# Patient Record
Sex: Female | Born: 1945 | Race: Black or African American | Hispanic: No | Marital: Married | Smoking: Never smoker
Health system: Southern US, Community
[De-identification: ages and names within clinical notes are randomized; demographics above are authoritative.]

## PROBLEM LIST (undated history)

## (undated) DIAGNOSIS — I639 Cerebral infarction, unspecified: Secondary | ICD-10-CM

## (undated) DIAGNOSIS — E785 Hyperlipidemia, unspecified: Secondary | ICD-10-CM

## (undated) DIAGNOSIS — E119 Type 2 diabetes mellitus without complications: Secondary | ICD-10-CM

## (undated) DIAGNOSIS — I10 Essential (primary) hypertension: Secondary | ICD-10-CM

## (undated) HISTORY — DX: Hyperlipidemia, unspecified: E78.5

---

## 2001-01-05 ENCOUNTER — Emergency Department (HOSPITAL_COMMUNITY): Admission: EM | Admit: 2001-01-05 | Discharge: 2001-01-05 | Payer: Self-pay | Admitting: Emergency Medicine

## 2001-01-05 ENCOUNTER — Encounter: Payer: Self-pay | Admitting: Emergency Medicine

## 2001-08-15 ENCOUNTER — Ambulatory Visit (HOSPITAL_COMMUNITY): Admission: EM | Admit: 2001-08-15 | Discharge: 2001-08-15 | Payer: Self-pay | Admitting: Emergency Medicine

## 2001-08-15 ENCOUNTER — Encounter: Payer: Self-pay | Admitting: Emergency Medicine

## 2002-07-24 ENCOUNTER — Encounter: Payer: Self-pay | Admitting: Emergency Medicine

## 2002-07-25 ENCOUNTER — Inpatient Hospital Stay (HOSPITAL_COMMUNITY): Admission: EM | Admit: 2002-07-25 | Discharge: 2002-07-26 | Payer: Self-pay | Admitting: Emergency Medicine

## 2002-07-26 ENCOUNTER — Encounter: Payer: Self-pay | Admitting: Cardiovascular Disease

## 2003-01-25 ENCOUNTER — Emergency Department (HOSPITAL_COMMUNITY): Admission: EM | Admit: 2003-01-25 | Discharge: 2003-01-26 | Payer: Self-pay | Admitting: Emergency Medicine

## 2004-01-14 ENCOUNTER — Encounter: Admission: RE | Admit: 2004-01-14 | Discharge: 2004-01-14 | Payer: Self-pay | Admitting: Cardiovascular Disease

## 2004-02-08 ENCOUNTER — Encounter: Admission: RE | Admit: 2004-02-08 | Discharge: 2004-02-08 | Payer: Self-pay | Admitting: Cardiovascular Disease

## 2004-03-28 ENCOUNTER — Encounter: Admission: RE | Admit: 2004-03-28 | Discharge: 2004-03-28 | Payer: Self-pay | Admitting: Cardiovascular Disease

## 2004-09-29 ENCOUNTER — Emergency Department (HOSPITAL_COMMUNITY): Admission: AD | Admit: 2004-09-29 | Discharge: 2004-09-29 | Payer: Self-pay | Admitting: Family Medicine

## 2004-12-06 ENCOUNTER — Encounter: Admission: RE | Admit: 2004-12-06 | Discharge: 2004-12-06 | Payer: Self-pay | Admitting: Cardiovascular Disease

## 2004-12-11 ENCOUNTER — Ambulatory Visit (HOSPITAL_COMMUNITY): Admission: RE | Admit: 2004-12-11 | Discharge: 2004-12-11 | Payer: Self-pay | Admitting: Cardiovascular Disease

## 2005-03-06 ENCOUNTER — Encounter: Admission: RE | Admit: 2005-03-06 | Discharge: 2005-03-06 | Payer: Self-pay | Admitting: Cardiovascular Disease

## 2005-07-18 ENCOUNTER — Emergency Department (HOSPITAL_COMMUNITY): Admission: EM | Admit: 2005-07-18 | Discharge: 2005-07-18 | Payer: Self-pay | Admitting: Family Medicine

## 2005-10-11 ENCOUNTER — Encounter: Admission: RE | Admit: 2005-10-11 | Discharge: 2005-10-11 | Payer: Self-pay | Admitting: Cardiovascular Disease

## 2006-05-10 ENCOUNTER — Emergency Department (HOSPITAL_COMMUNITY): Admission: EM | Admit: 2006-05-10 | Discharge: 2006-05-10 | Payer: Self-pay | Admitting: Family Medicine

## 2006-10-04 ENCOUNTER — Encounter: Admission: RE | Admit: 2006-10-04 | Discharge: 2006-10-04 | Payer: Self-pay | Admitting: Cardiovascular Disease

## 2006-10-13 ENCOUNTER — Emergency Department (HOSPITAL_COMMUNITY): Admission: EM | Admit: 2006-10-13 | Discharge: 2006-10-13 | Payer: Self-pay | Admitting: Emergency Medicine

## 2006-10-14 ENCOUNTER — Encounter: Admission: RE | Admit: 2006-10-14 | Discharge: 2006-10-14 | Payer: Self-pay | Admitting: Cardiovascular Disease

## 2006-10-23 ENCOUNTER — Encounter: Admission: RE | Admit: 2006-10-23 | Discharge: 2006-10-23 | Payer: Self-pay | Admitting: Cardiovascular Disease

## 2006-10-23 ENCOUNTER — Other Ambulatory Visit: Admission: RE | Admit: 2006-10-23 | Discharge: 2006-10-23 | Payer: Self-pay | Admitting: Diagnostic Radiology

## 2006-10-23 ENCOUNTER — Encounter (INDEPENDENT_AMBULATORY_CARE_PROVIDER_SITE_OTHER): Payer: Self-pay | Admitting: Specialist

## 2007-01-01 ENCOUNTER — Observation Stay (HOSPITAL_COMMUNITY): Admission: AD | Admit: 2007-01-01 | Discharge: 2007-01-02 | Payer: Self-pay | Admitting: Cardiovascular Disease

## 2007-09-04 ENCOUNTER — Encounter: Admission: RE | Admit: 2007-09-04 | Discharge: 2007-09-04 | Payer: Self-pay | Admitting: Cardiovascular Disease

## 2008-01-19 ENCOUNTER — Emergency Department (HOSPITAL_COMMUNITY): Admission: EM | Admit: 2008-01-19 | Discharge: 2008-01-19 | Payer: Self-pay | Admitting: Emergency Medicine

## 2008-01-26 ENCOUNTER — Inpatient Hospital Stay (HOSPITAL_COMMUNITY): Admission: EM | Admit: 2008-01-26 | Discharge: 2008-01-28 | Payer: Self-pay | Admitting: Emergency Medicine

## 2010-12-08 NOTE — Cardiovascular Report (Signed)
[place]. [facility]  Patient:    Wendy Allison, Wendy Allison Visit Number: [id number] MRN: [id number]          Service Type: MED Location: 1800 [id number] [id number] Attending Physician:  Devoria Albe Dictated by:   Ricki Rodriguez, M.D. Proc. Date: 08/15/01 Admit Date:  08/15/2001                          Cardiac Catheterization  HOSPITAL LOCATION: Emergency room.  PROCEDURES: Left heart catheterization, selective coronary angiography and left ventricular function study.  INDICATIONS: This 65 year old black female, with recurrent chest pain had abnormal stress test and has failed medical therapy. The patient has a history of diabetes and hypertension, along with obesity.  APPROACH: Right femoral artery using 6 French diagnostic catheters.  COMPLICATIONS: None.  Perclose suture was applied at the end of the procedure without any complication.  HEMODYNAMIC DATA: The left ventricular pressure was 160/10 mmHg and aortic pressure 160/19 mmHg.  LEFT VENTRICULOGRAM: The left ventriculogram showed normal left ventricular systolic function with ejection fraction of 65%.  CORONARY ANATOMY: Left main: The left main coronary artery was unremarkable.  Left left anterior descending: Left anterior descending coronary artery had ostial 20% eccentric stenosis. The diagonal vessel was unremarkable.  Left circumflex: The left circumflex coronary artery was also unremarkable, had a tortuous obtuse marginal branch, and the ramus branch was unremarkable.  Right coronary artery: The right coronary artery was dominant and unremarkable.  IMPRESSION: 1. Mild left anterior descending coronary artery disease. 2. Normal left ventricular systolic function. 3. Noncardiac chest pain.  RECOMMENDATIONS: This patient will be treated medically with continuing antihypertensive medications and diabetic medication, along with emphasis on the diet and activity, and will add Protonix 40 mg one daily to  her current medication list. Dictated by:   Ricki Rodriguez, M.D. Attending Physician:  Devoria Albe DD:  08/15/01 TD:  08/16/01 Job: [id number]

## 2010-12-08 NOTE — Discharge Summary (Signed)
NAMEJAKAIYA, Wendy Allison NO.:  [id number]   MEDICAL RECORD NO.:  [id number]          PATIENT TYPE:  INP   LOCATION:  2003                         FACILITY:  [facility]   PHYSICIAN:  Eduardo Osier. Sharyn Lull, M.D. DATE OF BIRTH:  [birth year]   DATE OF ADMISSION:  01/25/2008  DATE OF DISCHARGE:  01/28/2008                               DISCHARGE SUMMARY   ADMITTING DIAGNOSES:  1. Chest pain rule out myocardial infarction.  2. Hypertension.  3. Non-insulin-dependent diabetes mellitus.  4. Morbid obesity.  5. Hypercholesteremia.  6. Positive family history of coronary artery disease.   DISCHARGE DIAGNOSES:  1. Status post chest pain, myocardial infarction ruled out.  2. Coronary artery disease.  3. Hypertension.  4. Diabetes mellitus.  5. Hypercholesteremia.  6. Morbid obesity.  7. Positive family history of coronary artery disease.   DISCHARGE HOME MEDICATIONS:  1. Enteric-coated aspirin 81 mg one tablet daily.  2. Toprol-XL 50 mg one tablet daily.  3. Diovan 160 mg one tablet daily.  4. Metformin 1000 mg one tablet twice daily.  5. Crestor 10 mg one tablet daily.  6. Nitrostat 0.4 mg sublingual use as directed.  7. Prilosec 20 mg one tablet daily.   DIET:  Low-salt, low-cholesterol, and 1800 calories ADA diet.   ACTIVITY:  As tolerated.  Increase activity slowly.  The patient has  been advised to monitor blood sugar and BP daily.   Follow up with Dr. Algie Coffer in two weeks.   Condition on discharge is stable.   BRIEF HISTORY AND HOSPITAL COURSE:  Wendy Allison is a 65 year old black  female with past medical history significant for hypertension, non-  insulin-dependent diabetes mellitus, hypercholesteremia, and Bell palsy,  morbid obesity, and positive family history of coronary artery disease.  She came to the ER via EMS complaining of retrosternal chest pain  radiating to the left arm associated with mild shortness of breath and  diaphoresis.  The patient states chest  pain lasted for 20 minutes.  She  took two sublingual nitro and baby aspirin with relief of chest pain.  Denies any history of exertional chest pain, but complains of exertional  dyspnea.  Denies palpitation, lightheadedness, or syncope.  Denies PND,  orthopnea, or leg swelling.  Denies any cough, fever, or chills.  Denies  relation of chest pain to food, breathing, or movement.   PAST MEDICAL HISTORY:  As above.   PAST SURGICAL HISTORY:  She had tubal ligation in the past and  subsequently then had hysterectomy in the past.   ALLERGIES:  No known drug allergies.   MEDICATIONS AT HOME:  She is on metformin and Diovan.   SOCIAL HISTORY:  She is married and three children.  No history of  smoking or alcohol abuse.  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   FAMILY HISTORY:  Father died of massive MI at the age of 24.  Mother's  do not know.  One sister had irregular heartbeat.   PHYSICAL EXAMINATION:  GENERAL:  She was alert and oriented x3, in no  acute distress.  VITAL SIGNS:  Blood pressure was 148/73 and pulse was 98 and regular.  HEENT:  Conjunctiva was pink.  NECK:  Supple.  No JVD and no bruit.  LUNGS:  Clear to auscultation without rhonchi or rales.  CARDIOVASCULAR:  S1 and S2 was normal.  There was soft systolic murmur.  ABDOMEN:  Soft, obese, and nontender.  EXTREMITIES:  There is no clubbing, cyanosis, or edema.   EKG done in the ER showed normal sinus rhythm with poor R-wave  progression and nonspecific ST-T wave changes.  Three sets of cardiac  enzymes were normal.  Cholesterol was 134.  HDL was low 33.  LDL was 84.  Her hemoglobin A1c was 9.1, which was elevated.  Hemoglobin was 12.4,  hematocrit 38.2, white count of 11.3.  Electrolyte sodium 139, potassium  3.6, chloride 103, bicarb 28, glucose 238, BUN 21, and creatinine 0.80.  Persantine Myoview done on July 7, showed no evidence of reversible  ischemia and normal LV function with EF of 66%.   BRIEF HOSPITAL  COURSE:  The patient was admitted to telemetry unit.  MI  was ruled out by serial enzymes and EKG.  The patient subsequently  underwent Persantine Myoview, which showed no evidence of reversible  ischemia with normal EF.  The patient did not had any further episodes  of chest pain during the hospital stay and was discharged home on above  medications.      Eduardo Osier. Sharyn Lull, M.D.  Electronically Signed    MNH/MEDQ  D:  02/26/2008  T:  02/27/2008  Job:  [id number]   cc:   Ricki Rodriguez, M.D.

## 2010-12-08 NOTE — Discharge Summary (Signed)
NAMEROSAMOND, ANDRESS NO.:  [id number]   MEDICAL RECORD NO.:  [id number]          PATIENT TYPE:  OBV   LOCATION:  3742                         FACILITY:  [facility]   PHYSICIAN:  Ricki Rodriguez, M.D.  DATE OF BIRTH:  26-Apr-1946   DATE OF ADMISSION:  01/01/2007  DATE OF DISCHARGE:  01/02/2007                               DISCHARGE SUMMARY   FINAL DIAGNOSES:  1. Chest pain.  2. Diabetes mellitus, II.  3. Hypertension.  4. Obesity.  5. Old cerebrovascular accident.   DISCHARGE MEDICATIONS:  1. Diovan 80/12.5 mg 1 daily.  2. Glipizide 5 mg daily.  3. Darvocet-N 100 four times daily.  4. Nitroglycerin 0.4 mg 1 sublingual every 5 minutes x3 as needed for      chest pain.  5. Kay Ciel 10 mEq 1 daily.  6. Aspirin 81 mg 1 daily.  7. Metoprolol 225 mg daily.  8. Multivitamin 1 daily.  9. Lipitor 10 mg 1 daily.   DISCHARGE DIET:  Low sodium, heart healthy diet.   DISCHARGE ACTIVITY:  The patient is to increase activity slowly.   SPECIAL INSTRUCTIONS:  The patient to stop any activity that causes  chest pain, shortness of breath, dizziness, sweating, or excessive  weakness.   FOLLOWUP:  Followup by Dr. Orpah Cobb in 2 weeks.  The patient to call  [phone] for appointment.   CONDITION ON DISCHARGE:  Improved.   HISTORY:  This 65 year old, black female presented with 1 day history of  chest pain, sharp, retrosternal, recurrent associated with dizziness.   PHYSICAL EXAMINATION:  VITAL SIGNS:  Pulse 82, respirations 14, blood  pressure 122/67, height 71 inches, weight 250 pounds.  HEENT:  The patient is normocephalic atraumatic.  Has brown eyes.  Pupils are equal and reactive to light.  Extraocular movement intact.  NECK:  No JVD.  No carotid bruits.  LUNGS:  Clear bilaterally.  HEART:  Normal S1, S2.  ABDOMEN:  Soft.  EXTREMITIES:  No edema, cyanosis, or clubbing.  CNS:  Cranial nerves grossly intact, and the patient moves all 4  extremities.   LABORATORY  DATA:  1. Borderline WBC count 11.8, hemoglobin borderline at 11.6, normal      platelet count, normal PT/INR, normal electrolytes.  Blood sugar      elevated at 216, BUN 7, creatinine 0.76, albumin low at 3.1,      alkaline phosphatase slightly high at 134.  The rest of the liver      enzymes were normal.  CK-MB and troponin I negative x3.      Cholesterol 154, triglyceride 103, HDL cholesterol low at 32, LDL      cholesterol 101.  Thyroid-stimulating hormone was low at 0.007.   1. Nuclear stress test showed no evidence of pharmacologically      inducible ischemia.  Ejection fraction was 67%.   EKG revealed a normal sinus rhythm.   Chest x-ray revealed cardiac enlargement without heart failure   HOSPITAL COURSE:  The patient was admitted to Telemetry Unit.  Myocardial infarction was ruled out.  She underwent nuclear stress test  that failed to show any reversible ischemia.  Hence, the patient was  discharged home in satisfactory condition with adjustment in her  medications.  Her diabetes condition would be managed on outpatient  level.      Ricki Rodriguez, M.D.  Electronically Signed     ASK/MEDQ  D:  02/05/2007  T:  02/06/2007  Job:  [id number]

## 2010-12-08 NOTE — Discharge Summary (Signed)
Wendy Allison, BOTTENFIELD NO.:  [id number]   MEDICAL RECORD NO.:  [id number]                   PATIENT TYPE:  INP   LOCATION:  5509 Bed 1                           FACILITY:  [facility]   PHYSICIAN:  Ricki Rodriguez, M.D.               DATE OF BIRTH:  1946/05/09   DATE OF ADMISSION:  07/25/2002  DATE OF DISCHARGE:  07/26/2002                                 DISCHARGE SUMMARY   FINAL DIAGNOSES:  1. Chest pain.  2. Diabetes mellitus.  3. Obesity.  4. Hypertension.   DISCHARGE MEDICATIONS:  1. Diovan HCT 80/12.5 mg 1 p.o. daily.  2. Glucotrol XL 5 mg 1 p.o. daily.  3. Darvocet-N 100 1 four times daily as needed for chest pain.  4. K-Dur 10 mEq 1 daily.  5. Nitroglycerin 0.4 mg one under the tongue as needed for chest pain every     5 minutes as directed.  6. Aspirin 81 mg one daily.  7. Pain management as needed, Darvocet.   ACTIVITY:  As tolerated.   DISCHARGE DIET:  Low fat, low salt diabetic diet.   FOLLOWUP:  Followup by Ricki Rodriguez, M.D. in three weeks and by Dr. Sande Brothers in two weeks.   HISTORY:  This 65 year old black female complained of midline sharp to dull  chest pain radiating to her left arm.  The patient had cardiac  catheterization done one year ago with a minimal coronary artery disease.  Chest pain also is increased by taking deep breaths without any fever or  cough.  Has cardiac risk factors of diabetes, hypertension and obesity.   PHYSICAL EXAMINATION:  VITAL SIGNS:  Temperature 98, pulse 81, respirations  20, blood pressure 153/75.  Height:  5 feet, 8 inches.  Weight:  250 pounds.  GENERAL:  Alert, oriented x3.  HEENT:  Head:  Normocephalic, atraumatic.  Eyes:  Manson Passey.  Pupils equal,  reactive to light.  Extraocular movements intact.  Mucous membranes pink and  moist.  NECK:  No JVD, no thyromegaly.  LUNGS:  Clear.  CHEST:  Chest wall tender in the midsternal area.  HEART:  Normal, S1, S2 with a 2/6 midsystolic murmur.  ABDOMEN:  Soft and distended.  EXTREMITIES:  No cyanosis, clubbing.  1+ edema bilaterally.  CNS:  Bilaterally equal grips.   LABORATORY DATA:  Slightly low potassium of 3.2, CK 49, MB 0.01.  EKG:  Normal sinus rhythm.  Subsequent potassium was 3.8.  Nuclear stress test  negative for any reversible ischemia.   HOSPITAL COURSE:  The patient was admitted to telemetry unit.  Myocardial  infarction was ruled out.  She underwent a nuclear stress test that did not  show any reversible ischemia and her chest pain being more noncardiac, she  was discharged home in satisfactory condition with followup by me in two  weeks if needed and by primary care doctor in two weeks.  Ricki Rodriguez, M.D.    ASK/MEDQ  D:  08/27/2002  T:  08/27/2002  Job:  [id number]   cc:   Sande Brothers, M.D.

## 2011-04-19 LAB — BASIC METABOLIC PANEL
CO2: 25
Chloride: 101
Creatinine, Ser: 0.72
GFR calc Af Amer: >60
Potassium: 3.4 — ABNORMAL LOW

## 2011-04-19 LAB — HEMOGLOBIN A1C: Mean Plasma Glucose: 247

## 2011-04-19 LAB — DIFFERENTIAL
Basophils Relative: 0
Lymphocytes Relative: 23
Lymphs Abs: 2.7
Monocytes Absolute: 0.4
Monocytes Relative: 3
Neutro Abs: 8.2 — ABNORMAL HIGH

## 2011-04-19 LAB — CARDIAC PANEL(CRET KIN+CKTOT+MB+TROPI)
CK, MB: 0.5
CK, MB: 0.6
Relative Index: INVALID
Troponin I: <0.01

## 2011-04-19 LAB — COMPREHENSIVE METABOLIC PANEL
AST: 20
Albumin: 3.4 — ABNORMAL LOW
BUN: 18
Calcium: 9.9
Creatinine, Ser: 0.8
GFR calc Af Amer: >60
GFR calc non Af Amer: >60
Total Bilirubin: 0.7

## 2011-04-19 LAB — CBC
Hemoglobin: 12.4
MCHC: 32.5
Platelets: 258
RBC: 4.55
RBC: 4.89
WBC: 10.4
WBC: 11.3 — ABNORMAL HIGH

## 2011-04-19 LAB — LIPID PANEL
Cholesterol: 134
HDL: 33 — ABNORMAL LOW
LDL Cholesterol: 84
Triglycerides: 85

## 2011-04-19 LAB — CK TOTAL AND CKMB (NOT AT ARMC)
CK, MB: 0.7
Relative Index: INVALID
Total CK: 29

## 2011-04-19 LAB — POCT I-STAT, CHEM 8
BUN: 21
Calcium, Ion: 1.22
Chloride: 103
Creatinine, Ser: 1
Glucose, Bld: 238 — ABNORMAL HIGH
Potassium: 3.6

## 2011-04-19 LAB — POCT CARDIAC MARKERS
CKMB, poc: <1 — ABNORMAL LOW
Operator id: 270651
Troponin i, poc: <0.05
Troponin i, poc: <0.05

## 2011-04-19 LAB — HEPARIN LEVEL (UNFRACTIONATED): Heparin Unfractionated: 0.83 — ABNORMAL HIGH

## 2011-04-19 LAB — PROTIME-INR
INR: 0.9
Prothrombin Time: 12.8

## 2011-04-19 LAB — TROPONIN I: Troponin I: <0.01

## 2011-05-10 LAB — COMPREHENSIVE METABOLIC PANEL
AST: 18
Albumin: 3.1 — ABNORMAL LOW
Alkaline Phosphatase: 134 — ABNORMAL HIGH
BUN: 7
GFR calc Af Amer: >60
Potassium: 3.7
Sodium: 141
Total Protein: 7.2

## 2011-05-10 LAB — PROTIME-INR: INR: 1

## 2011-05-10 LAB — B-NATRIURETIC PEPTIDE (CONVERTED LAB): Pro B Natriuretic peptide (BNP): <30

## 2011-05-10 LAB — CBC
HCT: 35.3 — ABNORMAL LOW
HCT: 36.2
Hemoglobin: 11.5 — ABNORMAL LOW
MCHC: 32.7
Platelets: 275
RDW: 16.9 — ABNORMAL HIGH
RDW: 16.9 — ABNORMAL HIGH
WBC: 11.8 — ABNORMAL HIGH

## 2011-05-10 LAB — CARDIAC PANEL(CRET KIN+CKTOT+MB+TROPI)
CK, MB: 0.6
Relative Index: INVALID
Relative Index: INVALID
Total CK: 31
Total CK: 33
Troponin I: 0.01

## 2011-05-10 LAB — DIFFERENTIAL
Basophils Relative: 0
Eosinophils Relative: 1
Lymphocytes Relative: 31
Monocytes Absolute: 1.1 — ABNORMAL HIGH
Monocytes Relative: 9
Neutro Abs: 7

## 2011-05-10 LAB — APTT: aPTT: 33

## 2011-05-10 LAB — LIPID PANEL
Cholesterol: 154
LDL Cholesterol: 101 — ABNORMAL HIGH

## 2014-09-20 DIAGNOSIS — M1990 Unspecified osteoarthritis, unspecified site: Secondary | ICD-10-CM | POA: Diagnosis not present

## 2014-09-20 DIAGNOSIS — I10 Essential (primary) hypertension: Secondary | ICD-10-CM | POA: Diagnosis not present

## 2014-09-20 DIAGNOSIS — E119 Type 2 diabetes mellitus without complications: Secondary | ICD-10-CM | POA: Diagnosis not present

## 2014-09-20 DIAGNOSIS — E118 Type 2 diabetes mellitus with unspecified complications: Secondary | ICD-10-CM | POA: Diagnosis not present

## 2014-09-20 DIAGNOSIS — E0590 Thyrotoxicosis, unspecified without thyrotoxic crisis or storm: Secondary | ICD-10-CM | POA: Diagnosis not present

## 2014-09-20 DIAGNOSIS — E780 Pure hypercholesterolemia: Secondary | ICD-10-CM | POA: Diagnosis not present

## 2014-09-20 DIAGNOSIS — I2510 Atherosclerotic heart disease of native coronary artery without angina pectoris: Secondary | ICD-10-CM | POA: Diagnosis not present

## 2014-09-20 DIAGNOSIS — F329 Major depressive disorder, single episode, unspecified: Secondary | ICD-10-CM | POA: Diagnosis not present

## 2014-10-06 DIAGNOSIS — R9431 Abnormal electrocardiogram [ECG] [EKG]: Secondary | ICD-10-CM | POA: Diagnosis not present

## 2014-10-06 DIAGNOSIS — I493 Ventricular premature depolarization: Secondary | ICD-10-CM | POA: Diagnosis not present

## 2014-10-06 DIAGNOSIS — K921 Melena: Secondary | ICD-10-CM | POA: Diagnosis not present

## 2014-11-10 DIAGNOSIS — E0590 Thyrotoxicosis, unspecified without thyrotoxic crisis or storm: Secondary | ICD-10-CM | POA: Diagnosis not present

## 2014-11-10 DIAGNOSIS — Z139 Encounter for screening, unspecified: Secondary | ICD-10-CM | POA: Diagnosis not present

## 2014-11-10 DIAGNOSIS — I10 Essential (primary) hypertension: Secondary | ICD-10-CM | POA: Diagnosis not present

## 2014-11-10 DIAGNOSIS — E785 Hyperlipidemia, unspecified: Secondary | ICD-10-CM | POA: Diagnosis not present

## 2014-11-10 DIAGNOSIS — Z1159 Encounter for screening for other viral diseases: Secondary | ICD-10-CM | POA: Diagnosis not present

## 2014-11-10 DIAGNOSIS — E119 Type 2 diabetes mellitus without complications: Secondary | ICD-10-CM | POA: Diagnosis not present

## 2014-11-11 DIAGNOSIS — Z1231 Encounter for screening mammogram for malignant neoplasm of breast: Secondary | ICD-10-CM | POA: Diagnosis not present

## 2014-11-11 DIAGNOSIS — E119 Type 2 diabetes mellitus without complications: Secondary | ICD-10-CM | POA: Diagnosis not present

## 2014-11-25 DIAGNOSIS — H409 Unspecified glaucoma: Secondary | ICD-10-CM | POA: Diagnosis not present

## 2014-11-25 DIAGNOSIS — E119 Type 2 diabetes mellitus without complications: Secondary | ICD-10-CM | POA: Diagnosis not present

## 2014-11-25 DIAGNOSIS — E0590 Thyrotoxicosis, unspecified without thyrotoxic crisis or storm: Secondary | ICD-10-CM | POA: Diagnosis not present

## 2014-11-25 DIAGNOSIS — I159 Secondary hypertension, unspecified: Secondary | ICD-10-CM | POA: Diagnosis not present

## 2014-11-25 DIAGNOSIS — E785 Hyperlipidemia, unspecified: Secondary | ICD-10-CM | POA: Diagnosis not present

## 2015-01-19 DIAGNOSIS — E0590 Thyrotoxicosis, unspecified without thyrotoxic crisis or storm: Secondary | ICD-10-CM | POA: Diagnosis not present

## 2015-01-19 DIAGNOSIS — E119 Type 2 diabetes mellitus without complications: Secondary | ICD-10-CM | POA: Diagnosis not present

## 2015-02-04 DIAGNOSIS — H2513 Age-related nuclear cataract, bilateral: Secondary | ICD-10-CM | POA: Diagnosis not present

## 2015-02-04 DIAGNOSIS — H4011X1 Primary open-angle glaucoma, mild stage: Secondary | ICD-10-CM | POA: Diagnosis not present

## 2015-02-04 DIAGNOSIS — E119 Type 2 diabetes mellitus without complications: Secondary | ICD-10-CM | POA: Diagnosis not present

## 2015-03-15 DIAGNOSIS — E119 Type 2 diabetes mellitus without complications: Secondary | ICD-10-CM | POA: Diagnosis not present

## 2015-03-17 DIAGNOSIS — H9313 Tinnitus, bilateral: Secondary | ICD-10-CM | POA: Diagnosis not present

## 2015-03-17 DIAGNOSIS — E119 Type 2 diabetes mellitus without complications: Secondary | ICD-10-CM | POA: Diagnosis not present

## 2015-03-17 DIAGNOSIS — E785 Hyperlipidemia, unspecified: Secondary | ICD-10-CM | POA: Diagnosis not present

## 2015-03-17 DIAGNOSIS — Z23 Encounter for immunization: Secondary | ICD-10-CM | POA: Diagnosis not present

## 2015-03-17 DIAGNOSIS — I10 Essential (primary) hypertension: Secondary | ICD-10-CM | POA: Diagnosis not present

## 2015-03-17 DIAGNOSIS — E0590 Thyrotoxicosis, unspecified without thyrotoxic crisis or storm: Secondary | ICD-10-CM | POA: Diagnosis not present

## 2015-03-31 DIAGNOSIS — H9313 Tinnitus, bilateral: Secondary | ICD-10-CM | POA: Diagnosis not present

## 2015-03-31 DIAGNOSIS — H903 Sensorineural hearing loss, bilateral: Secondary | ICD-10-CM | POA: Diagnosis not present

## 2015-12-08 ENCOUNTER — Emergency Department (HOSPITAL_COMMUNITY): Payer: Medicare Other

## 2015-12-08 ENCOUNTER — Encounter (HOSPITAL_COMMUNITY): Payer: Self-pay | Admitting: Emergency Medicine

## 2015-12-08 ENCOUNTER — Emergency Department (HOSPITAL_COMMUNITY)
Admission: EM | Admit: 2015-12-08 | Discharge: 2015-12-08 | Disposition: A | Discharge status: Home / Self Care | Payer: Medicare Other | Attending: Emergency Medicine | Admitting: Emergency Medicine

## 2015-12-08 DIAGNOSIS — M25569 Pain in unspecified knee: Secondary | ICD-10-CM | POA: Diagnosis not present

## 2015-12-08 DIAGNOSIS — M25551 Pain in right hip: Secondary | ICD-10-CM | POA: Diagnosis not present

## 2015-12-08 DIAGNOSIS — M1990 Unspecified osteoarthritis, unspecified site: Secondary | ICD-10-CM

## 2015-12-08 DIAGNOSIS — M169 Osteoarthritis of hip, unspecified: Secondary | ICD-10-CM | POA: Insufficient documentation

## 2015-12-08 DIAGNOSIS — S8992XA Unspecified injury of left lower leg, initial encounter: Secondary | ICD-10-CM

## 2015-12-08 DIAGNOSIS — Y939 Activity, unspecified: Secondary | ICD-10-CM | POA: Insufficient documentation

## 2015-12-08 DIAGNOSIS — M25562 Pain in left knee: Secondary | ICD-10-CM | POA: Diagnosis not present

## 2015-12-08 DIAGNOSIS — M179 Osteoarthritis of knee, unspecified: Secondary | ICD-10-CM | POA: Diagnosis not present

## 2015-12-08 DIAGNOSIS — E119 Type 2 diabetes mellitus without complications: Secondary | ICD-10-CM | POA: Diagnosis not present

## 2015-12-08 DIAGNOSIS — Y999 Unspecified external cause status: Secondary | ICD-10-CM | POA: Diagnosis not present

## 2015-12-08 DIAGNOSIS — M7989 Other specified soft tissue disorders: Secondary | ICD-10-CM | POA: Diagnosis not present

## 2015-12-08 DIAGNOSIS — M25572 Pain in left ankle and joints of left foot: Secondary | ICD-10-CM | POA: Diagnosis not present

## 2015-12-08 DIAGNOSIS — W109XXA Fall (on) (from) unspecified stairs and steps, initial encounter: Secondary | ICD-10-CM | POA: Insufficient documentation

## 2015-12-08 DIAGNOSIS — Y9289 Other specified places as the place of occurrence of the external cause: Secondary | ICD-10-CM | POA: Diagnosis not present

## 2015-12-08 DIAGNOSIS — R52 Pain, unspecified: Secondary | ICD-10-CM

## 2015-12-08 DIAGNOSIS — S79911A Unspecified injury of right hip, initial encounter: Secondary | ICD-10-CM | POA: Diagnosis not present

## 2015-12-08 DIAGNOSIS — M1611 Unilateral primary osteoarthritis, right hip: Secondary | ICD-10-CM | POA: Diagnosis not present

## 2015-12-08 DIAGNOSIS — W108XXA Fall (on) (from) other stairs and steps, initial encounter: Secondary | ICD-10-CM

## 2015-12-08 DIAGNOSIS — Z8673 Personal history of transient ischemic attack (TIA), and cerebral infarction without residual deficits: Secondary | ICD-10-CM | POA: Insufficient documentation

## 2015-12-08 DIAGNOSIS — T148 Other injury of unspecified body region: Secondary | ICD-10-CM | POA: Diagnosis not present

## 2015-12-08 HISTORY — DX: Type 2 diabetes mellitus without complications: E11.9

## 2015-12-08 HISTORY — DX: Cerebral infarction, unspecified: I63.9

## 2015-12-08 NOTE — ED Notes (Signed)
Patient transported to X-ray 

## 2015-12-08 NOTE — ED Notes (Signed)
Per EMS. Pt fell down steps of bus today. Pt has chronic R leg weakness. Fell onto L side. Pt did not have cane at time of fall. No LOC or deformities.

## 2015-12-08 NOTE — ED Provider Notes (Signed)
CSN: [id number]     Arrival date & time 12/08/15  1454 History   By signing my name below, I, Marisue Humble, attest that this documentation has been prepared under the direction and in the presence of non-physician practitioner, Trixie Dredge, PA-C. Electronically Signed: Marisue Humble, Scribe. 12/08/2015. 4:11 PM.   Chief Complaint  Patient presents with  . Fall   The history is provided by the patient. No language interpreter was used.   HPI Comments:  Wendy Allison is a 70 y.o. female with PMhx of stroke and DM who presents to the Emergency Department via EMS s/p fall complaining of left knee pain. She was stepping off the bus and fell off the bottom stair to the ground, landing on her left knee. Pt states her right leg gave out, causing her to fall. Pt has chronic right hip and leg pain, worse directly after standing up, for the past year. She has not been evaluated for this pain; she moved to [place] from [place] in December.This right hip pain is unchanged. No alleviating factors noted or treatments attempted PTA. Last Tetanus 3-4 years ago. Denies weakness, numbness in feet, head trauma, syncope, back pain, abdominal pain, neck pain or headache.  Denies any other injury other than left knee.   Past Medical History  Diagnosis Date  . Stroke (HCC)   . Diabetes mellitus without complication (HCC)    History reviewed. No pertinent past surgical history. History reviewed. No pertinent family history. Social History  Substance Use Topics  . Smoking status: Never Smoker   . Smokeless tobacco: None  . Alcohol Use: No   OB History    No data available     Review of Systems  Constitutional: Negative for activity change and appetite change.  Cardiovascular: Negative for chest pain.  Gastrointestinal: Negative for abdominal pain.  Musculoskeletal: Positive for myalgias, joint swelling and arthralgias. Negative for neck pain.  Allergic/Immunologic: Negative for  immunocompromised state.  Neurological: Negative for syncope, weakness, numbness and headaches.  Hematological: Does not bruise/bleed easily.    Allergies  Review of patient's allergies indicates no known allergies.  Home Medications   Prior to Admission medications   Not on File   BP 181/79 mmHg  Pulse 93  Temp(Src) 98.3 F (36.8 C) (Oral)  SpO2 97% Physical Exam  Constitutional: She appears well-developed and well-nourished. No distress.  HENT:  Head: Normocephalic and atraumatic.  Neck: Neck supple.  Pulmonary/Chest: Effort normal.  Abdominal: Soft. She exhibits no distension and no mass. There is no tenderness. There is no rebound and no guarding.  Musculoskeletal:  Abrasion to inferior left knee; tenderness over proximal tibia and into left knee; BL pitting edema to the shin; pain in proximal thigh with ROM of right hip; Spine nontender, no crepitus, or stepoffs.  Neurological: She is alert.  Skin: She is not diaphoretic.  Nursing note and vitals reviewed.   ED Course  Procedures  DIAGNOSTIC STUDIES:  Oxygen Saturation is 97% on RA, normal by my interpretation.    COORDINATION OF CARE:  4:06 PM Will order x-ray of unilateral hips with pelvis and CT of left knee. Discussed treatment plan with pt at bedside and pt agreed to plan.  Labs Review Labs Reviewed - No data to display  Imaging Review Dg Ankle Complete Left  12/08/2015  CLINICAL DATA:  Fall today, chronic right leg weakness. Anterior left knee pain and left ankle pain. EXAM: LEFT ANKLE COMPLETE - 3+ VIEW COMPARISON:  None. FINDINGS: Osseous alignment  is normal. Bone mineralization is normal. No fracture line or displaced fracture fragment identified. Ankle mortise is symmetric. There is soft tissue swelling overlying the medial malleolus. Chronic enthesophytes noted along the dorsal and plantar margins of the calcaneus. IMPRESSION: 1. No acute fracture or dislocation. 2. Soft tissue swelling. Electronically  Signed   By: Bary RichardStan  Maynard M.D.   On: 12/08/2015 15:36   Ct Knee Left Wo Contrast  12/08/2015  CLINICAL DATA:  Anterior left knee pain after falling today. Initial encounter. EXAM: CT OF THE LEFT KNEE WITHOUT CONTRAST TECHNIQUE: Multidetector CT imaging of the left knee was performed according to the standard protocol. Multiplanar CT image reconstructions were also generated. COMPARISON:  Radiograph same date. FINDINGS: There is some motion artifact on the more inferior images which were repeated. Bones: No evidence of acute fracture or dislocation. Joint/cartilage: Moderate tricompartmental osteoarthritis with prominent osteophytes. No evidence of loose body or significant joint effusion. Ligaments: As evaluated by CT, the cruciate and collateral ligaments appear intact. Tendon/muscles: The extensor mechanism is intact. No muscular abnormalities are seen. Neurovascular/other soft tissues: Minimal anterior subcutaneous edema without focal fluid collection. IMPRESSION: 1. No acute osseous findings at the left knee. 2. Tricompartmental osteoarthritis. No significant joint effusion or evidence of loose body. Electronically Signed   By: Carey BullocksWilliam  Veazey M.D.   On: 12/08/2015 16:44   Dg Knee Complete 4 Views Left  12/08/2015  CLINICAL DATA:  Anterior left knee pain status post fall. EXAM: LEFT KNEE - COMPLETE 4+ VIEW COMPARISON:  03/06/2005 FINDINGS: No evidence of displaced fracture, dislocation, or joint effusion. There are 3 compartment osteoarthritic changes of the left knee, with joint space narrowing, subchondral sclerosis, osteophyte formation and mild chondrocalcinosis. There is mild depression of the lateral tibial plateau, which is felt to be degenerative in nature. Correlation to physical exam however is recommended. Soft tissues are unremarkable. IMPRESSION: No definitive evidence of displaced fracture. Mild depression of the lateral tibial plateau, likely degenerative in nature. Correlation to physical  exam is recommended. 3 compartment moderate in severity osteoarthritic changes of the left knee. Electronically Signed   By: Ted Mcalpineobrinka  Dimitrova M.D.   On: 12/08/2015 15:41   Dg Hip Unilat With Pelvis 2-3 Views Right  12/08/2015  CLINICAL DATA:  RIGHT hip pain today.  Fall from bus. EXAM: DG HIP (WITH OR WITHOUT PELVIS) 2-3V RIGHT COMPARISON:  None. FINDINGS: Hips are located. No evidence of pelvic fracture or sacral fracture. Dedicated view of the RIGHT hip demonstrates no femoral neck fracture. There is sclerosis of the LEFT and RIGHT hip joint, greater on the RIGHT. IMPRESSION: No evidence of pelvic fracture or RIGHT hip fracture. 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 Electronically Signed   By: Genevive BiStewart  Edmunds M.D.   On: 12/08/2015 16:33   I have personally reviewed and evaluated these images and lab results as part of my medical decision-making.   EKG Interpretation None      MDM   Final diagnoses:  Fall (on) (from) other stairs and steps, initial encounter  Left knee injury, initial encounter  Arthritis    Afebrile, nontoxic patient with injury to her left knee after fall from last step of bus onto ground.  Has chronic right hip pain and this is what she states caused the fall.   Xray left knee concerning for arthritis vs fracture.  CT confirms arthritis only.  Right hip film also with arthritis.  Neurovascularly intact.   D/C home with close PCP follow up, resources given.  Rx for walking cane also given.  Discussed result, findings, treatment, and follow up  with patient.  Pt given return precautions.  Pt verbalizes understanding and agrees with plan.        I personally performed the services described in this documentation, which was scribed in my presence. The recorded information has been reviewed and is accurate.    Trixie Dredge, PA-C 12/08/15 1720  Arby Barrette, MD 12/08/15 [phone]

## 2015-12-08 NOTE — Discharge Instructions (Signed)
Read the information below.  You may return to the Emergency Department at any time for worsening condition or any new symptoms that concern you.   Arthritis Arthritis means joint pain. It can also mean joint disease. A joint is a place where bones come together. People who have arthritis may have:  Red joints.  Swollen joints.  Stiff joints.  Warm joints.  A fever.  A feeling of being sick. HOME CARE Pay attention to any changes in your symptoms. Take these actions to help with your pain and swelling. Medicines  Take over-the-counter and prescription medicines only as told by your doctor.  Do not take aspirin for pain if your doctor says that you may have gout. Activities  Rest your joint if your doctor tells you to.  Avoid activities that make the pain worse.  Exercise your joint regularly as told by your doctor. Try doing exercises like:  Swimming.  Water aerobics.  Biking.  Walking. Joint Care  If your joint is swollen, keep it raised (elevated) if told by your doctor.  If your joint feels stiff in the morning, try taking a warm shower.  If you have diabetes, do not apply heat without asking your doctor.  If told, apply heat to the joint:  Put a towel between the joint and the hot pack or heating pad.  Leave the heat on the area for 20-30 minutes.  If told, apply ice to the joint:  Put ice in a plastic bag.  Place a towel between your skin and the bag.  Leave the ice on for 20 minutes, 2-3 times per day.  Keep all follow-up visits as told by your doctor. GET HELP IF:  The pain gets worse.  You have a fever. GET HELP RIGHT AWAY IF:  You have very bad pain in your joint.  You have swelling in your joint.  Your joint is red.  Many joints become painful and swollen.  You have very bad back pain.  Your leg is very weak.  You cannot control your pee (urine) or poop (stool).   This information is not intended to replace advice given to  you by your health care provider. Make sure you discuss any questions you have with your health care provider.   Document Released: 10/03/2009 Document Revised: 03/30/2015 Document Reviewed: 10/04/2014 Elsevier Interactive Patient Education 2016 [employer].   [employer] Assistance The United Ways 211 is a great source of information about community services available.  Access by dialing 2-1-1 from anywhere in [place], or by website -  PooledIncome.pl.   Other Local Resources (Updated 07/2015)  Financial Assistance   Services    Phone Number and Address  [facility]  Low-cost medical care - 1st and 3rd Saturday of every month  Must not qualify for public or private insurance and must have limited income [phone] [address] [place], [place]    [place] [employer] of Social Services  Child care  Emergency assistance for housing and [employer]  Medicaid [phone] [address] [place], [place] [address]   [facility] Department  Low-cost medical care for children, communicable diseases, sexually-transmitted diseases, immunizations, maternity care, womens health and family planning [phone] [address] [place], [place] [address]  [facility]   Medication assistance for [facility] residents  Must meet income requirements [phone] [address] [place], [place].    [facility]  Child care  Emergency assistance for housing and [employer]ly[employer]arkutilities  Food stamps  Medicaid [phone] [id number] [address] [place], [place] [address]  [facility]   Low-cost medical care,   Monday through Friday, 9 am to 6 pm.   Accepts Medicare/Medicaid, and self-pay [phone] [address] [place], [place] [address]  [facility]  Low-cost medical care - Monday  through Friday, 8:30 am - 5:30 pm  Accepts Medicaid and self-pay [phone] [address] [place], [place] [address]   [facility]  Primary medical care, including for those with sickle cell disease  Accepts Medicare, Medicaid, insurance and self-pay [phone] [address] [place], [place]  [facility]   Primary medical care  Accepts Medicare, [place], insurance and self-pay [phone] [address], [address] [place], [place] [address]   [facility]  Child care  Emergency assistance for housing and [employer]ly[employer]arkutilities  Food stamps  Medicaid [phone] [address] [place], [place] [address]  [facility] of Health and [employer] Services  Child care  Emergency assistance for housing and [employer]arkutilities  Food stamps  Medicaid [phone] [address] [place], [place] [address]   [facility] Medication Assistance Program  Medication assistance for [facility] residents with no insurance only  Must have a primary care doctor [phone] [address] [place], [place]  [facility]   Primary medical care  [place] Medicare, [place], insurance  [phone] [address] [place], [place]  [facility]   Medication assistance [phone]  [facility]   Primary medical care  Accepts Medicare, [place], insurance and self-pay [phone] [address] [place], [place] [address]  [facility]   Primary medical care  Accepts Medicare, [place], insurance and self-pay [phone] [address] [place], [place] [address]  [facility]  For [place] residents between the ages of 7018 and 5764 who do not have any form of health insurance, Medicare, [place], or [place] benefits.  Services are provided free of charge to uninsured patients who fall within federal poverty  guidelines.    Hours: Tuesdays and Thursdays, 4:15 - 8 pm [phone] [address], [address] [place], [place] [address]  [facility]     Primary medical care  Dental care  Nutritional counseling  Pharmacy  Accepts Medicaid, Medicare, most insurance.  Fees are adjusted based on ability to pay.   [phone] [facility] [address] [place], [place]  [phone] [facility] [address] [place], [place]  [phone] [facility] [place], [place]  [phone] [facility], [address] [place], [place]  [phone] [facility] [address] [place], [place]  [facility]  Womens health and family planning [phone] [address] [place], [place]  [facility] of Social Services  Child care  Emergency assistance for housing and Kimberly-Clarkutilities  Food stamps  Medicaid [phone] [address], [place], [place] [address]   [facility]    Ages 7918 and older  Hours: Mondays and Thursdays, 7:00 am - 9:00 am Patients are seen on a first come, first served basis. [phone] [address] [place], [place]  [facility] Social Services  Child care  Emergency assistance for housing and Kimberly[employer]arkutilities  Food stamps  Medicaid [phone] [address] [place], [place] [address]  [facility]  Medication assistance  Rental assistance  Food pantry  Medication assistance  Housing assistance  Emergency food distribution  Utility assistance [phone] [address] [place], [place]  [phone]  [address] [place], [place] [address] Hours: Tuesdays and Thursdays from 9am - 12 noon by appointment only  [phone] [address] [place], [place] [address]  [facility] Lanae Boast   Accepts private  insurance, [place], and [place].  Payment is based on a sliding scale for those without insurance.  Hours: Mondays, Tuesdays and Thursdays, 8:30 am - 5:30 pm.   [phone] [address] [place] [place], [place]  [facility] - Family Medicine at [facility], [place], and [place].  Payment is based on a sliding scale for those without insurance. [phone] [address] [place], [place]  [facility] - Pediatrics at E. Scientist, research (physical sciences), [employer], and [place].  Payment is based on a sliding scale for those without insurance [phone] [address], [employer], [place]  [facility] - Pediatrics at [employer], [place], and [place].  Payment is based on a sliding scale for those without insurance. [phone] [address] [place], [place]  [facility] - Pediatrics at [facility], [place], and [place].  Payment is based on a sliding scale for those without insurance. [phone], ext. [phone] [place], [place].    [facility]  Maternity care.  Accepts Medicaid and self-pay. [phone] [address] [place], [place]

## 2016-05-25 DIAGNOSIS — Z23 Encounter for immunization: Secondary | ICD-10-CM | POA: Diagnosis not present

## 2016-08-04 ENCOUNTER — Emergency Department (HOSPITAL_COMMUNITY): Payer: Medicare Other

## 2016-08-04 ENCOUNTER — Emergency Department (HOSPITAL_COMMUNITY)
Admission: EM | Admit: 2016-08-04 | Discharge: 2016-08-04 | Disposition: A | Discharge status: Home / Self Care | Payer: Medicare Other | Attending: Emergency Medicine | Admitting: Emergency Medicine

## 2016-08-04 ENCOUNTER — Encounter (HOSPITAL_COMMUNITY): Payer: Self-pay

## 2016-08-04 DIAGNOSIS — R079 Chest pain, unspecified: Secondary | ICD-10-CM

## 2016-08-04 DIAGNOSIS — Z7982 Long term (current) use of aspirin: Secondary | ICD-10-CM | POA: Insufficient documentation

## 2016-08-04 DIAGNOSIS — Z7984 Long term (current) use of oral hypoglycemic drugs: Secondary | ICD-10-CM | POA: Diagnosis not present

## 2016-08-04 DIAGNOSIS — E119 Type 2 diabetes mellitus without complications: Secondary | ICD-10-CM | POA: Insufficient documentation

## 2016-08-04 DIAGNOSIS — Z8673 Personal history of transient ischemic attack (TIA), and cerebral infarction without residual deficits: Secondary | ICD-10-CM | POA: Insufficient documentation

## 2016-08-04 LAB — CBC
HEMATOCRIT: 36.4 % (ref 36.0–46.0)
Hemoglobin: 11.6 g/dL — ABNORMAL LOW (ref 12.0–15.0)
MCH: 25.7 pg — ABNORMAL LOW (ref 26.0–34.0)
MCHC: 31.9 g/dL (ref 30.0–36.0)
MCV: 80.7 fL (ref 78.0–100.0)
PLATELETS: 215 10*3/uL (ref 150–400)
RBC: 4.51 MIL/uL (ref 3.87–5.11)
RDW: 14.4 % (ref 11.5–15.5)
WBC: 7.7 10*3/uL (ref 4.0–10.5)

## 2016-08-04 LAB — BASIC METABOLIC PANEL
Anion gap: 8 (ref 5–15)
BUN: 13 mg/dL (ref 6–20)
CHLORIDE: 109 mmol/L (ref 101–111)
CO2: 26 mmol/L (ref 22–32)
CREATININE: 1.02 mg/dL — AB (ref 0.44–1.00)
Calcium: 9.2 mg/dL (ref 8.9–10.3)
GFR calc Af Amer: >60 mL/min (ref >60)
GFR calc non Af Amer: 55 mL/min — ABNORMAL LOW (ref >60)
GLUCOSE: 131 mg/dL — AB (ref 65–99)
POTASSIUM: 2.8 mmol/L — AB (ref 3.5–5.1)
Sodium: 143 mmol/L (ref 135–145)

## 2016-08-04 LAB — I-STAT TROPONIN, ED
Troponin i, poc: 0.01 ng/mL (ref 0.00–0.08)
Troponin i, poc: 0.01 ng/mL (ref 0.00–0.08)

## 2016-08-04 MED ORDER — OXYCODONE-ACETAMINOPHEN 5-325 MG PO TABS
2.0000 | ORAL_TABLET | Freq: Once | ORAL | Status: DC
  Filled 2016-08-04: qty 2

## 2016-08-04 MED ORDER — POTASSIUM CHLORIDE CRYS ER 20 MEQ PO TBCR
40.0000 meq | EXTENDED_RELEASE_TABLET | Freq: Once | ORAL | Status: AC
  Administered 2016-08-04: 40 meq via ORAL
  Filled 2016-08-04: qty 2

## 2016-08-04 MED ORDER — ASPIRIN 81 MG PO CHEW
324.0000 mg | CHEWABLE_TABLET | Freq: Once | ORAL | Status: AC
  Administered 2016-08-04: 324 mg via ORAL
  Filled 2016-08-04: qty 4

## 2016-08-04 MED ORDER — NITROGLYCERIN 0.4 MG SL SUBL: 0.4000 mg | SUBLINGUAL_TABLET | SUBLINGUAL | Status: DC | PRN

## 2016-08-04 NOTE — ED Provider Notes (Signed)
[facility]EMERGENCY DEPT Provider Note   CSN: [id number] Arrival date & time: 08/04/16  1500     History   Chief Complaint Chief Complaint  Patient presents with  . Chest Pain    HPI Wendy Allison is a 71 y.o. female.  HPI   71 y.o. Female h.o. Dm, stroke presents with complaints of one day h.o. Of chest pain sscp to left with intermittently for one week with no associated symptoms.  Last seconds.  Denies nausea, vomiting, diaphoresis, cough, fever chills.  She denies h.o. dvt or pe.  Patient has been noncompliant with her meds for the past year since moving from [place] and does not have a doctor here.  No pain in back.  She states she has been evaluated previously for similar symptoms but always sent home.   Past Medical History:  Diagnosis Date  . Diabetes mellitus without complication (HCC)   . Stroke [facility](HCC)     There are no active problems to display for this patient.   History reviewed. No pertinent surgical history.  OB History    No data available       Home Medications    Prior to Admission medications   Medication Sig Start Date End Date Taking? Authorizing Provider  aspirin EC 81 MG tablet Take 81 mg by mouth daily.    Historical Provider, MD  metFORMIN (GLUCOPHAGE) 1000 MG tablet Take 1,000 mg by mouth 2 (two) times daily with a meal.    Historical Provider, MD    Family History History reviewed. No pertinent family history.  Social History Social History  Substance Use Topics  . Smoking status: Never Smoker  . Smokeless tobacco: Never Used  . Alcohol use No     Allergies   Patient has no known allergies.   Review of Systems Review of Systems  All other systems reviewed and are negative.    Physical Exam Updated Vital Signs BP 169/63 (BP Location: Right Arm)   Pulse 97   Temp 97.8 F (36.6 C) (Oral)   Resp 25   SpO2 100%   Physical Exam  Constitutional: She is oriented to person, place, and time. She appears well-developed and  well-nourished.  HENT:  Head: Normocephalic and atraumatic.  Right Ear: External ear normal.  Left Ear: External ear normal.  Nose: Nose normal.  Mouth/Throat: Oropharynx is clear and moist.  Eyes: EOM are normal. Pupils are equal, round, and reactive to light.  Neck: Normal range of motion. Neck supple.  Cardiovascular: Normal rate and regular rhythm.   Pulmonary/Chest: Effort normal and breath sounds normal.  Abdominal: Soft. Bowel sounds are normal.  Musculoskeletal: She exhibits edema. She exhibits no tenderness or deformity.  Neurological: She is alert and oriented to person, place, and time. No cranial nerve deficit. Coordination normal.  Skin: Skin is warm and dry.  Psychiatric: She has a normal mood and affect. Her behavior is normal. Judgment and thought content normal.  Nursing note and vitals reviewed.    ED Treatments / Results  Labs (all labs ordered are listed, but only abnormal results are displayed) Labs Reviewed  BASIC METABOLIC PANEL  CBC  I-STAT TROPOININ, ED    EKG  EKG Interpretation  Date/Time:  Saturday August 04 2016 15:16:51 EST Ventricular Rate:  92 PR Interval:    QRS Duration: 90 QT Interval:  365 QTC Calculation: 452 R Axis:   -13 Text Interpretation:  Normal sinus rhythm Non-specific ST-t changes New since previous tracing Confirmed by Diavian Furgason MD,  Aleta Manternach [phone]) on 08/04/2016 3:24:03 PM       Radiology No results found.  Procedures Procedures (including critical care time)  Medications Ordered in ED Medications  aspirin chewable tablet 324 mg (not administered)     Initial Impression / Assessment and Plan / ED Course  I have reviewed the triage vital signs and the nursing notes.  Pertinent labs & imaging results that were available during my care of the patient were reviewed by me and considered in my medical decision making (see chart for details). Clinical Course     Patient pain free during work up Aspirin given.   Discussed with fellow on call for cards and agrees with plan to repeat troponin.  If normal, given atypical symptoms, will d/c wit cardiology follow up.  Repeat i-STAT troponin ordered at 1715 and still pending  Repeat lactic acid normal and patient continues pain-free. Patient advised to follow-up and return precautions and voices understanding Final Clinical Impressions(s) / ED Diagnoses   Final diagnoses:  Chest pain, unspecified type    New Prescriptions New Prescriptions   No medications on file     Margarita Grizzle, MD 08/04/16 2021

## 2016-08-04 NOTE — ED Notes (Signed)
Patient up ambulatory to the bathroom without any difficulty or distress at this time 

## 2016-08-04 NOTE — ED Notes (Signed)
Patient returning from bathroom; placed back on monitor, continuous pulse oximetry and blood pressure cuff

## 2016-08-04 NOTE — ED Notes (Signed)
ED Provider at bedside. 

## 2016-08-04 NOTE — ED Triage Notes (Signed)
Onset 1 year abd and chest pain.  Last chest pain was at 10 am, lasted 5 min, radiated to left chest.

## 2016-08-04 NOTE — ED Notes (Signed)
Pt requesting to establish primary care and to change her metformin due to giving her diarrhea. MD informed. Pt informed that MD will rewrite her prescription for the metformin and given a referral for primary care. Pt states I can't take that medication.

## 2016-08-04 NOTE — ED Notes (Signed)
Denies chest pain, c/o chronic right leg pain.

## 2016-08-04 NOTE — ED Notes (Signed)
Patient has returned from being out of the department; patient placed back on monitor, continuous pulse oximetry and blood pressure cuff 

## 2016-08-17 DIAGNOSIS — I10 Essential (primary) hypertension: Secondary | ICD-10-CM | POA: Diagnosis not present

## 2016-08-17 DIAGNOSIS — Z79899 Other long term (current) drug therapy: Secondary | ICD-10-CM | POA: Diagnosis not present

## 2016-08-17 DIAGNOSIS — E079 Disorder of thyroid, unspecified: Secondary | ICD-10-CM | POA: Diagnosis not present

## 2016-08-17 DIAGNOSIS — M79604 Pain in right leg: Secondary | ICD-10-CM | POA: Diagnosis not present

## 2016-08-17 DIAGNOSIS — E782 Mixed hyperlipidemia: Secondary | ICD-10-CM | POA: Diagnosis not present

## 2016-08-27 ENCOUNTER — Other Ambulatory Visit: Payer: Self-pay | Admitting: Family Medicine

## 2016-08-27 DIAGNOSIS — Z1231 Encounter for screening mammogram for malignant neoplasm of breast: Secondary | ICD-10-CM

## 2016-08-27 DIAGNOSIS — M791 Myalgia: Secondary | ICD-10-CM | POA: Diagnosis not present

## 2016-08-27 DIAGNOSIS — E1165 Type 2 diabetes mellitus with hyperglycemia: Secondary | ICD-10-CM | POA: Diagnosis not present

## 2016-08-29 ENCOUNTER — Ambulatory Visit
Admission: RE | Admit: 2016-08-29 | Discharge: 2016-08-29 | Disposition: A | Discharge status: Home / Self Care | Payer: Medicare Other | Source: Ambulatory Visit | Attending: Family Medicine | Admitting: Family Medicine

## 2016-08-29 DIAGNOSIS — Z1231 Encounter for screening mammogram for malignant neoplasm of breast: Secondary | ICD-10-CM

## 2016-08-30 ENCOUNTER — Other Ambulatory Visit: Payer: Self-pay | Admitting: Family Medicine

## 2016-08-30 DIAGNOSIS — R928 Other abnormal and inconclusive findings on diagnostic imaging of breast: Secondary | ICD-10-CM

## 2016-08-31 ENCOUNTER — Other Ambulatory Visit (HOSPITAL_COMMUNITY): Payer: Self-pay | Admitting: Family Medicine

## 2016-08-31 DIAGNOSIS — E0590 Thyrotoxicosis, unspecified without thyrotoxic crisis or storm: Secondary | ICD-10-CM

## 2016-09-06 ENCOUNTER — Encounter (HOSPITAL_COMMUNITY)
Admission: RE | Admit: 2016-09-06 | Discharge: 2016-09-06 | Disposition: A | Discharge status: Home / Self Care | Payer: Medicare Other | Source: Ambulatory Visit | Attending: Family Medicine | Admitting: Family Medicine

## 2016-09-06 DIAGNOSIS — E0590 Thyrotoxicosis, unspecified without thyrotoxic crisis or storm: Secondary | ICD-10-CM | POA: Insufficient documentation

## 2016-09-07 ENCOUNTER — Encounter (HOSPITAL_COMMUNITY)
Admission: RE | Admit: 2016-09-07 | Discharge: 2016-09-07 | Disposition: A | Discharge status: Home / Self Care | Payer: Medicare Other | Source: Ambulatory Visit | Attending: Family Medicine | Admitting: Family Medicine

## 2016-09-07 DIAGNOSIS — E0590 Thyrotoxicosis, unspecified without thyrotoxic crisis or storm: Secondary | ICD-10-CM | POA: Diagnosis not present

## 2016-09-10 ENCOUNTER — Ambulatory Visit
Admission: RE | Admit: 2016-09-10 | Discharge: 2016-09-10 | Disposition: A | Discharge status: Home / Self Care | Payer: Medicare Other | Source: Ambulatory Visit | Attending: Family Medicine | Admitting: Family Medicine

## 2016-09-10 DIAGNOSIS — R928 Other abnormal and inconclusive findings on diagnostic imaging of breast: Secondary | ICD-10-CM

## 2016-09-10 DIAGNOSIS — N6011 Diffuse cystic mastopathy of right breast: Secondary | ICD-10-CM | POA: Diagnosis not present

## 2016-09-10 DIAGNOSIS — N6310 Unspecified lump in the right breast, unspecified quadrant: Secondary | ICD-10-CM | POA: Diagnosis not present

## 2016-09-27 DIAGNOSIS — I10 Essential (primary) hypertension: Secondary | ICD-10-CM | POA: Diagnosis not present

## 2016-09-27 DIAGNOSIS — L638 Other alopecia areata: Secondary | ICD-10-CM | POA: Diagnosis not present

## 2016-09-27 DIAGNOSIS — E1165 Type 2 diabetes mellitus with hyperglycemia: Secondary | ICD-10-CM | POA: Diagnosis not present

## 2016-09-27 DIAGNOSIS — E0500 Thyrotoxicosis with diffuse goiter without thyrotoxic crisis or storm: Secondary | ICD-10-CM | POA: Diagnosis not present

## 2016-10-17 DIAGNOSIS — E079 Disorder of thyroid, unspecified: Secondary | ICD-10-CM | POA: Diagnosis not present

## 2016-10-17 DIAGNOSIS — I10 Essential (primary) hypertension: Secondary | ICD-10-CM | POA: Diagnosis not present

## 2016-10-17 DIAGNOSIS — E782 Mixed hyperlipidemia: Secondary | ICD-10-CM | POA: Diagnosis not present

## 2016-10-24 DIAGNOSIS — H2513 Age-related nuclear cataract, bilateral: Secondary | ICD-10-CM | POA: Diagnosis not present

## 2016-10-24 DIAGNOSIS — H40013 Open angle with borderline findings, low risk, bilateral: Secondary | ICD-10-CM | POA: Diagnosis not present

## 2017-08-24 ENCOUNTER — Emergency Department (HOSPITAL_COMMUNITY)
Admission: EM | Admit: 2017-08-24 | Discharge: 2017-08-24 | Disposition: A | Discharge status: Home / Self Care | Payer: Medicare Other | Attending: Emergency Medicine | Admitting: Emergency Medicine

## 2017-08-24 ENCOUNTER — Encounter (HOSPITAL_COMMUNITY): Payer: Self-pay | Admitting: Emergency Medicine

## 2017-08-24 DIAGNOSIS — Z9114 Patient's other noncompliance with medication regimen: Secondary | ICD-10-CM | POA: Insufficient documentation

## 2017-08-24 DIAGNOSIS — R350 Frequency of micturition: Secondary | ICD-10-CM | POA: Insufficient documentation

## 2017-08-24 DIAGNOSIS — Z79899 Other long term (current) drug therapy: Secondary | ICD-10-CM | POA: Insufficient documentation

## 2017-08-24 DIAGNOSIS — I10 Essential (primary) hypertension: Secondary | ICD-10-CM | POA: Diagnosis not present

## 2017-08-24 DIAGNOSIS — E1165 Type 2 diabetes mellitus with hyperglycemia: Secondary | ICD-10-CM | POA: Diagnosis not present

## 2017-08-24 DIAGNOSIS — Z7982 Long term (current) use of aspirin: Secondary | ICD-10-CM | POA: Diagnosis not present

## 2017-08-24 DIAGNOSIS — R1030 Lower abdominal pain, unspecified: Secondary | ICD-10-CM | POA: Diagnosis present

## 2017-08-24 HISTORY — DX: Essential (primary) hypertension: I10

## 2017-08-24 LAB — I-STAT VENOUS BLOOD GAS, ED
Acid-base deficit: 1 mmol/L (ref 0.0–2.0)
BICARBONATE: 25.7 mmol/L (ref 20.0–28.0)
O2 SAT: 57 %
PCO2 VEN: 47.6 mmHg (ref 44.0–60.0)
PO2 VEN: 31 mmHg — AB (ref 32.0–45.0)
Patient temperature: 98.1
TCO2: 27 mmol/L (ref 22–32)
pH, Ven: 7.34 (ref 7.250–7.430)

## 2017-08-24 LAB — CBC WITH DIFFERENTIAL/PLATELET
BASOS ABS: 0 10*3/uL (ref 0.0–0.1)
Basophils Relative: 0 %
Eosinophils Absolute: 0 10*3/uL (ref 0.0–0.7)
Eosinophils Relative: 1 %
HEMATOCRIT: 38.7 % (ref 36.0–46.0)
HEMOGLOBIN: 12.7 g/dL (ref 12.0–15.0)
LYMPHS ABS: 2 10*3/uL (ref 0.7–4.0)
Lymphocytes Relative: 26 %
MCH: 27.1 pg (ref 26.0–34.0)
MCHC: 32.8 g/dL (ref 30.0–36.0)
MCV: 82.7 fL (ref 78.0–100.0)
Monocytes Absolute: 0.5 10*3/uL (ref 0.1–1.0)
Monocytes Relative: 7 %
NEUTROS ABS: 5.1 10*3/uL (ref 1.7–7.7)
Neutrophils Relative %: 66 %
Platelets: 202 10*3/uL (ref 150–400)
RBC: 4.68 MIL/uL (ref 3.87–5.11)
RDW: 14.6 % (ref 11.5–15.5)
WBC: 7.7 10*3/uL (ref 4.0–10.5)

## 2017-08-24 LAB — URINALYSIS, ROUTINE W REFLEX MICROSCOPIC
Bilirubin Urine: NEGATIVE
HGB URINE DIPSTICK: NEGATIVE
Ketones, ur: NEGATIVE mg/dL
Nitrite: NEGATIVE
PROTEIN: NEGATIVE mg/dL
SPECIFIC GRAVITY, URINE: 1.025 (ref 1.005–1.030)
pH: 6 (ref 5.0–8.0)

## 2017-08-24 LAB — BASIC METABOLIC PANEL
ANION GAP: 13 (ref 5–15)
BUN: 10 mg/dL (ref 6–20)
CHLORIDE: 102 mmol/L (ref 101–111)
CO2: 22 mmol/L (ref 22–32)
Calcium: 9.5 mg/dL (ref 8.9–10.3)
Creatinine, Ser: 1.06 mg/dL — ABNORMAL HIGH (ref 0.44–1.00)
GFR calc Af Amer: >60 mL/min (ref >60)
GFR calc non Af Amer: 52 mL/min — ABNORMAL LOW (ref >60)
Glucose, Bld: 552 mg/dL (ref 65–99)
POTASSIUM: 3.9 mmol/L (ref 3.5–5.1)
Sodium: 137 mmol/L (ref 135–145)

## 2017-08-24 LAB — I-STAT CHEM 8, ED
BUN: 11 mg/dL (ref 6–20)
CHLORIDE: 101 mmol/L (ref 101–111)
CREATININE: 0.9 mg/dL (ref 0.44–1.00)
Calcium, Ion: 1.22 mmol/L (ref 1.15–1.40)
GLUCOSE: 628 mg/dL — AB (ref 65–99)
HCT: 40 % (ref 36.0–46.0)
HEMOGLOBIN: 13.6 g/dL (ref 12.0–15.0)
Potassium: 3.6 mmol/L (ref 3.5–5.1)
SODIUM: 139 mmol/L (ref 135–145)
TCO2: 22 mmol/L (ref 22–32)

## 2017-08-24 LAB — CBG MONITORING, ED: Glucose-Capillary: 197 mg/dL — ABNORMAL HIGH (ref 65–99)

## 2017-08-24 MED ORDER — INSULIN ASPART 100 UNIT/ML IV SOLN
6.0000 [IU] | Freq: Once | INTRAVENOUS | Status: AC
Start: 2017-08-24 — End: 2017-08-24
  Administered 2017-08-24: 6 [IU] via INTRAVENOUS

## 2017-08-24 MED ORDER — ATENOLOL 50 MG PO TABS
100.0000 mg | ORAL_TABLET | Freq: Once | ORAL | Status: AC
  Administered 2017-08-24: 100 mg via ORAL
  Filled 2017-08-24: qty 2

## 2017-08-24 MED ORDER — METFORMIN HCL 1000 MG PO TABS: 1000.0000 mg | ORAL_TABLET | Freq: Two times a day (BID) | ORAL | 1 refills | Status: DC

## 2017-08-24 MED ORDER — SODIUM CHLORIDE 0.9 % IV BOLUS (SEPSIS)
1000.0000 mL | Freq: Once | INTRAVENOUS | Status: AC
  Administered 2017-08-24: 1000 mL via INTRAVENOUS

## 2017-08-24 MED ORDER — METFORMIN HCL 500 MG PO TABS
1000.0000 mg | ORAL_TABLET | Freq: Once | ORAL | Status: AC
Start: 2017-08-24 — End: 2017-08-24
  Administered 2017-08-24: 1000 mg via ORAL
  Filled 2017-08-24: qty 2

## 2017-08-24 MED ORDER — ATENOLOL 50 MG PO TABS: 50.0000 mg | ORAL_TABLET | Freq: Every day | ORAL | 1 refills | Status: DC

## 2017-08-24 NOTE — ED Provider Notes (Signed)
[facility] EMERGENCY DEPARTMENT Provider Note   CSN: [id number] Arrival date & time: 08/24/17  1522     History   Chief Complaint Chief Complaint  Patient presents with  . Urinary Frequency    HPI Wendy Allison is a 72 y.o. female.  Patient with history of high blood pressure, diabetes, stroke, currently noncompliant with medications presents for lower abdominal cramping and urinary frequency with dribbling. This is been intermittent for almost 1 year. Patient has not follow-up with her primary doctor. Patient denies any chest pain or shortness of breath. No concerning headaches.      Past Medical History:  Diagnosis Date  . Diabetes mellitus without complication (HCC)   . Hypertension   . Stroke [facility])     There are no active problems to display for this patient.   History reviewed. No pertinent surgical history.  OB History    No data available       Home Medications    Prior to Admission medications   Medication Sig Start Date End Date Taking? Authorizing Provider  aspirin EC 81 MG tablet Take 81 mg by mouth daily.    [provider]  atenolol (TENORMIN) 50 MG tablet Take 1 tablet (50 mg total) by mouth daily. 08/24/17   Blane Ohara, MD  metFORMIN (GLUCOPHAGE) 1000 MG tablet Take 1,000 mg by mouth 2 (two) times daily with a meal.    [provider]  metFORMIN (GLUCOPHAGE) 1000 MG tablet Take 1 tablet (1,000 mg total) by mouth 2 (two) times daily. 08/24/17   Blane Ohara, MD  methimazole (TAPAZOLE) 5 MG tablet Take 7.5 mg by mouth 2 (two) times daily.    [provider]  simvastatin (ZOCOR) 20 MG tablet Take 20 mg by mouth daily at 6 PM.    [provider]  valsartan-hydrochlorothiazide (DIOVAN-HCT) 320-25 MG tablet Take 1 tablet by mouth daily.    [provider]    Family History History reviewed. No pertinent family history.  Social History Social History   Tobacco Use  . Smoking status:  Never Smoker  . Smokeless tobacco: Never Used  Substance Use Topics  . Alcohol use: No  . Drug use: No     Allergies   Patient has no known allergies.   Review of Systems Review of Systems  Constitutional: Negative for chills and fever.  HENT: Negative for congestion.   Eyes: Negative for visual disturbance.  Respiratory: Negative for shortness of breath.   Cardiovascular: Negative for chest pain.  Gastrointestinal: Negative for abdominal pain and vomiting.  Genitourinary: Positive for frequency. Negative for dysuria and flank pain.  Musculoskeletal: Negative for back pain, neck pain and neck stiffness.  Skin: Negative for rash.  Neurological: Negative for light-headedness and headaches.     Physical Exam Updated Vital Signs BP (!) 170/79   Pulse (!) 103   Temp 98.1 F (36.7 C) (Oral)   Resp 16   SpO2 98%   Physical Exam  Constitutional: She is oriented to person, place, and time. She appears well-developed and well-nourished.  HENT:  Head: Normocephalic and atraumatic.  Eyes: Conjunctivae are normal. Right eye exhibits no discharge. Left eye exhibits no discharge.  Neck: Normal range of motion. Neck supple. No tracheal deviation present.  Cardiovascular: Normal rate and regular rhythm.  Pulmonary/Chest: Effort normal and breath sounds normal.  Abdominal: Soft. She exhibits no distension. There is no tenderness. There is no guarding.  Musculoskeletal: She exhibits no edema.  Neurological: She  is alert and oriented to person, place, and time.  Skin: Skin is warm. No rash noted.  Psychiatric: She has a normal mood and affect.  Nursing note and vitals reviewed.    ED Treatments / Results  Labs (all labs ordered are listed, but only abnormal results are displayed) Labs Reviewed  URINALYSIS, ROUTINE W REFLEX MICROSCOPIC - Abnormal; Notable for the following components:      Result Value   Color, Urine STRAW (*)    Glucose, UA >=500 (*)    Leukocytes, UA SMALL  (*)    Bacteria, UA RARE (*)    Squamous Epithelial / LPF 0-5 (*)    All other components within normal limits  I-STAT CHEM 8, ED - Abnormal; Notable for the following components:   Glucose, Bld 628 (*)    All other components within normal limits  CBC WITH DIFFERENTIAL/PLATELET  BASIC METABOLIC PANEL  I-STAT VENOUS BLOOD GAS, ED    EKG  EKG Interpretation None       Radiology No results found.  Procedures Procedures (including critical care time)  Medications Ordered in ED Medications  sodium chloride 0.9 % bolus 1,000 mL (not administered)  insulin aspart (novoLOG) injection 6 Units (not administered)  atenolol (TENORMIN) tablet 100 mg (not administered)  metFORMIN (GLUCOPHAGE) tablet 1,000 mg (not administered)     Initial Impression / Assessment and Plan / ED Course  I have reviewed the triage vital signs and the nursing notes.  Pertinent labs & imaging results that were available during my care of the patient were reviewed by me and considered in my medical decision making (see chart for details).    Patient presents with urinary frequency and noncompliance with medications for over 6 months. With elevated blood pressure likely secondary to noncompliance and essential hypertension however plan to check kidney function. With no demonstrable diabetes medicines plan check glucose. Blood work revealed significantly elevated glucose without anion gap. Plan for IV fluids, insulin, some of her home meds and very close follow up with a primary doctor.   Glu improved in ED, pt improved sxs.  Fup with pcp on Monday.    Final Clinical Impressions(s) / ED Diagnoses   Final diagnoses:  Type 2 diabetes mellitus with hyperglycemia, without long-term current use of insulin (HCC)  Urinary frequency    ED Discharge Orders        Ordered    atenolol (TENORMIN) 50 MG tablet  Daily     08/24/17 1701    metFORMIN (GLUCOPHAGE) 1000 MG tablet  2 times daily     08/24/17 1701         Blane OharaZavitz, Jaidalyn Schillo, MD 08/25/17 [phone]

## 2017-08-24 NOTE — Discharge Instructions (Signed)
Please stay hydrated with water and take your blood pressure and diabetic medications as instructed. Follow up closely with her primary care doctor Monday or Tuesday for reassessment.

## 2017-08-24 NOTE — ED Notes (Signed)
Date and time results received: 08/24/17 1820  Test: Glucose Critical Value: 552  Name of Provider Notified: Zavits MD

## 2017-08-24 NOTE — ED Triage Notes (Signed)
Pt presents to ED for assessment of 1 years worth of urinary frequency, dribbling before making it to the bathroom, lower abdominal cramping before having a BM.  Denies back pain.  States she also is supposed to be taking medicines for BP, but has not taken them in over 6 months.

## 2017-08-26 ENCOUNTER — Encounter (HOSPITAL_COMMUNITY): Payer: Self-pay | Admitting: Emergency Medicine

## 2017-08-26 ENCOUNTER — Other Ambulatory Visit: Payer: Self-pay

## 2017-08-26 ENCOUNTER — Ambulatory Visit (HOSPITAL_COMMUNITY)
Admission: EM | Admit: 2017-08-26 | Discharge: 2017-08-26 | Disposition: A | Discharge status: Home / Self Care | Payer: Medicare Other | Attending: Family Medicine | Admitting: Family Medicine

## 2017-08-26 DIAGNOSIS — E119 Type 2 diabetes mellitus without complications: Secondary | ICD-10-CM

## 2017-08-26 DIAGNOSIS — Z760 Encounter for issue of repeat prescription: Secondary | ICD-10-CM | POA: Diagnosis not present

## 2017-08-26 DIAGNOSIS — I10 Essential (primary) hypertension: Secondary | ICD-10-CM | POA: Diagnosis not present

## 2017-08-26 MED ORDER — METFORMIN HCL 1000 MG PO TABS: 1000.0000 mg | ORAL_TABLET | Freq: Two times a day (BID) | ORAL | 0 refills | Status: DC

## 2017-08-26 MED ORDER — ATENOLOL 50 MG PO TABS: 50.0000 mg | ORAL_TABLET | Freq: Every day | ORAL | 0 refills | Status: DC

## 2017-08-26 NOTE — ED Provider Notes (Signed)
[facility]    CSN: [id number] Arrival date & time: 08/26/17  1639     History   Chief Complaint Chief Complaint  Patient presents with  . Medication Refill    HPI Wendy Allison is a 72 y.o. female.   72 year old female with history of DM, HTN, stroke comes in for medication refill.  Patient was seen 2 days ago at the emergency department, states was told blood sugars is too high, and atenolol and metformin was supposed to be refilled.  Paperwork showed that she should have received printed scripts for both medication, however patient states has not did not receive any written prescriptions.  She has since called [facility] and has a PCP appointment this upcoming Friday.  Denies any chest pain, shortness of breath, numbness,  weakness, dizziness.      Past Medical History:  Diagnosis Date  . Diabetes mellitus without complication (HCC)   . Hypertension   . Stroke [facility](HCC)     There are no active problems to display for this patient.   History reviewed. No pertinent surgical history.  OB History    No data available       Home Medications    Prior to Admission medications   Medication Sig Start Date End Date Taking? Authorizing Provider  aspirin EC 81 MG tablet Take 81 mg by mouth daily.    [provider]  atenolol (TENORMIN) 50 MG tablet Take 1 tablet (50 mg total) by mouth daily. 08/26/17   Cathie HoopsYu, Antwonette Feliz V, PA-C  metFORMIN (GLUCOPHAGE) 1000 MG tablet Take 1 tablet (1,000 mg total) by mouth 2 (two) times daily. 08/26/17   Cathie HoopsYu, Sladen Plancarte V, PA-C  methimazole (TAPAZOLE) 5 MG tablet Take 7.5 mg by mouth 2 (two) times daily.    [provider]  simvastatin (ZOCOR) 20 MG tablet Take 20 mg by mouth daily at 6 PM.    [provider]  valsartan-hydrochlorothiazide (DIOVAN-HCT) 320-25 MG tablet Take 1 tablet by mouth daily.    [provider]    Family History No family history on file.  Social History Social History    Tobacco Use  . Smoking status: Never Smoker  . Smokeless tobacco: Never Used  Substance Use Topics  . Alcohol use: No  . Drug use: No     Allergies   Patient has no known allergies.   Review of Systems Review of Systems  Reason unable to perform ROS: See HPI as above.    Physical Exam Triage Vital Signs ED Triage Vitals  Enc Vitals Group     BP 08/26/17 1812 (!) 186/81     Pulse Rate 08/26/17 1812 93     Resp 08/26/17 1812 18     Temp 08/26/17 1812 98.3 F (36.8 C)     Temp src --      SpO2 08/26/17 1812 96 %     Weight --      Height --      Head Circumference --      Peak Flow --      Pain Score 08/26/17 1814 0     Pain Loc --      Pain Edu? --      Excl. in GC? --    No data found.  Updated Vital Signs BP (!) 186/81   Pulse 93   Temp 98.3 F (36.8 C)   Resp 18   SpO2 96%   Physical Exam  Constitutional: She is oriented  to person, place, and time. She appears well-developed and well-nourished. No distress.  HENT:  Head: Normocephalic and atraumatic.  Eyes: Conjunctivae are normal. Pupils are equal, round, and reactive to light.  Cardiovascular: Normal rate and regular rhythm. Exam reveals no gallop and no friction rub.  Murmur heard. Pulmonary/Chest: Effort normal and breath sounds normal. No stridor. No respiratory distress. She has no decreased breath sounds. She has no wheezes. She has no rhonchi. She has no rales.  Neurological: She is alert and oriented to person, place, and time.    UC Treatments / Results  Labs (all labs ordered are listed, but only abnormal results are displayed) Labs Reviewed - No data to display  EKG  EKG Interpretation None       Radiology No results found.  Procedures Procedures (including critical care time)  Medications Ordered in UC Medications - No data to display   Initial Impression / Assessment and Plan / UC Course  I have reviewed the triage vital signs and the nursing notes.  Pertinent labs  & imaging results that were available during my care of the patient were reviewed by me and considered in my medical decision making (see chart for details).    Atenolol and metformin refilled for 30 days.  Follow-up with PCP as scheduled for further management.  Return precautions given.  Patient expresses understanding and agrees to plan.  Final Clinical Impressions(s) / UC Diagnoses   Final diagnoses:  Medication refill    ED Discharge Orders        Ordered    atenolol (TENORMIN) 50 MG tablet  Daily     08/26/17 1833    metFORMIN (GLUCOPHAGE) 1000 MG tablet  2 times daily,   Status:  Discontinued     08/26/17 1833    metFORMIN (GLUCOPHAGE) 1000 MG tablet  2 times daily     08/26/17 1835        Belinda Fisher, PA-C 08/26/17 Paulo Fruit

## 2017-08-26 NOTE — ED Triage Notes (Signed)
Pt states she needs a script for metformin, and atenolol, states they did not give her a prescription for it when she went to the ER two days ago. Pt states she needs this medicine.

## 2017-08-26 NOTE — Discharge Instructions (Signed)
I have refilled your atenolol and metformin for 30 days. Please follow up with PCP as scheduled for further management. Please go to the emergency department if experiencing and chest pain, shortness of breath, palpitations, weakness, dizziness.

## 2017-09-06 ENCOUNTER — Ambulatory Visit: Payer: Medicare Other | Attending: Internal Medicine | Admitting: Physician Assistant

## 2017-09-06 VITALS — BP 184/89 | HR 80 | Temp 97.6°F | Ht 68.0 in | Wt 213.8 lb

## 2017-09-06 DIAGNOSIS — E039 Hypothyroidism, unspecified: Secondary | ICD-10-CM | POA: Diagnosis not present

## 2017-09-06 DIAGNOSIS — Z794 Long term (current) use of insulin: Secondary | ICD-10-CM | POA: Insufficient documentation

## 2017-09-06 DIAGNOSIS — Z8673 Personal history of transient ischemic attack (TIA), and cerebral infarction without residual deficits: Secondary | ICD-10-CM | POA: Diagnosis not present

## 2017-09-06 DIAGNOSIS — Z8249 Family history of ischemic heart disease and other diseases of the circulatory system: Secondary | ICD-10-CM | POA: Insufficient documentation

## 2017-09-06 DIAGNOSIS — Z7982 Long term (current) use of aspirin: Secondary | ICD-10-CM | POA: Insufficient documentation

## 2017-09-06 DIAGNOSIS — R600 Localized edema: Secondary | ICD-10-CM | POA: Diagnosis not present

## 2017-09-06 DIAGNOSIS — R350 Frequency of micturition: Secondary | ICD-10-CM | POA: Diagnosis not present

## 2017-09-06 DIAGNOSIS — Z833 Family history of diabetes mellitus: Secondary | ICD-10-CM | POA: Diagnosis not present

## 2017-09-06 DIAGNOSIS — R32 Unspecified urinary incontinence: Secondary | ICD-10-CM | POA: Diagnosis not present

## 2017-09-06 DIAGNOSIS — E118 Type 2 diabetes mellitus with unspecified complications: Secondary | ICD-10-CM

## 2017-09-06 DIAGNOSIS — Z79899 Other long term (current) drug therapy: Secondary | ICD-10-CM | POA: Insufficient documentation

## 2017-09-06 DIAGNOSIS — R197 Diarrhea, unspecified: Secondary | ICD-10-CM | POA: Insufficient documentation

## 2017-09-06 DIAGNOSIS — T50995A Adverse effect of other drugs, medicaments and biological substances, initial encounter: Secondary | ICD-10-CM | POA: Diagnosis not present

## 2017-09-06 DIAGNOSIS — E1165 Type 2 diabetes mellitus with hyperglycemia: Secondary | ICD-10-CM | POA: Insufficient documentation

## 2017-09-06 DIAGNOSIS — E0590 Thyrotoxicosis, unspecified without thyrotoxic crisis or storm: Secondary | ICD-10-CM | POA: Diagnosis not present

## 2017-09-06 DIAGNOSIS — I10 Essential (primary) hypertension: Secondary | ICD-10-CM | POA: Diagnosis not present

## 2017-09-06 LAB — GLUCOSE, POCT (MANUAL RESULT ENTRY): POC Glucose: 535 mg/dl — AB (ref 70–99)

## 2017-09-06 MED ORDER — ATENOLOL 50 MG PO TABS: 50.0000 mg | ORAL_TABLET | Freq: Every day | ORAL | 3 refills | Status: DC

## 2017-09-06 MED ORDER — TRUEPLUS LANCETS 28G MISC: 28.0000 g | Freq: Four times a day (QID) | 3 refills | Status: DC

## 2017-09-06 MED ORDER — GLUCOSE BLOOD VI STRP: ORAL_STRIP | 12 refills | Status: DC

## 2017-09-06 MED ORDER — TRUE METRIX METER DEVI: 1.0000 | Freq: Four times a day (QID) | 0 refills | Status: AC

## 2017-09-06 MED ORDER — TRUE METRIX METER DEVI: 1.0000 | Freq: Four times a day (QID) | 0 refills | Status: DC

## 2017-09-06 MED ORDER — INSULIN GLARGINE 100 UNIT/ML SOLOSTAR PEN: 20.0000 [IU] | PEN_INJECTOR | Freq: Every day | SUBCUTANEOUS | 3 refills | Status: DC

## 2017-09-06 MED ORDER — VALSARTAN-HYDROCHLOROTHIAZIDE 320-25 MG PO TABS: 1.0000 | ORAL_TABLET | Freq: Every day | ORAL | 3 refills | Status: DC

## 2017-09-06 MED ORDER — ASPIRIN EC 81 MG PO TBEC: 81.0000 mg | DELAYED_RELEASE_TABLET | Freq: Every day | ORAL | 0 refills | Status: DC

## 2017-09-06 MED ORDER — LOSARTAN POTASSIUM-HCTZ 100-12.5 MG PO TABS: 1.0000 | ORAL_TABLET | Freq: Every day | ORAL | 3 refills | Status: DC

## 2017-09-06 MED FILL — LANTUS SOLOSTAR 100 UNITS/M: 100 | 30 days supply | Qty: 6 | Fill #0

## 2017-09-06 MED FILL — LOSARTAN-HCTZ 100-12.5 MG T: 100-12.5 | 30 days supply | Qty: 30 | Fill #0

## 2017-09-06 NOTE — Addendum Note (Signed)
Addended by: Ronette DeterFARRINGTON, ALYCIA V on: 09/06/2017 03:33 PM   Modules accepted: Orders

## 2017-09-06 NOTE — Progress Notes (Signed)
Metformin causes diarrhea so patient is not taking it or only dosing with a half a tab  Patient state that MD wanted her thyroid checked   BS 535

## 2017-09-06 NOTE — Progress Notes (Signed)
Wendy Allison  GYF:[id number]  FFM:[id number]  DOB - 1945/07/25  Chief Complaint  Patient presents with  . Hospitalization Follow-up    blood sugar 600  . Hypertension  . Diabetes  . Urinary Frequency  . Urinary Incontinence       Subjective:   Wendy Allison is a 72 y.o. female here today for establishment of care. She has a hx of hypertension and hypothyroidism. She also has. She previously was cared has not been seen recently. She states that their personalities didn't match. She states she was told that she could discontinue all of her medications.  She went to the hospital on 08/24/2017 complaints of lower abdominal cramping, urinary frequency and urinary dribbling. The symptoms had at least been going on for one year. She was found to have a systolic blood pressure of 170. Her pulse was 103. Her glucose was over 600 without an anion gap. She was given saline, insulin, beta blockers and metformin. She was prescribed metformin and atenolol at discharge. She states she never received the prescriptions. 2 days later on 08/26/2017 she really presented with the need for medication refills. Her systolic blood pressure then was 186. No further assessment was undertaken she was given metformin 1000 g twice daily and atenolol 50 mg daily.  At home she took 2 doses of metformin and states that it caused diarrhea so she discontinued this medication. She does not have a meter at home. She has little insight into her diabetes. She also states that she's been told that she should be on thyroid medication for which she has not taken in at least a year. No specific complaints today.   ROS: GEN: denies fever or chills, denies change in weight Skin: denies lesions or rashes HEENT: denies headache, earache, epistaxis, sore throat, or neck pain LUNGS: denies SHOB, dyspnea, PND, orthopnea CV: denies CP or palpitations ABD: + abd pain, N or V EXT: denies muscle spasms or swelling; no pain in lower ext,  no weakness NEURO: denies numbness or tingling, denies sz, stroke or TIA   ALLERGIES: No Known Allergies  PAST MEDICAL HISTORY: Past Medical History:  Diagnosis Date  . Diabetes mellitus without complication ([place])   . Hypertension   . Stroke [facility])     PAST SURGICAL HISTORY: No past surgical history on file.  MEDICATIONS AT HOME: Prior to Admission medications   Medication Sig Start Date End Date Taking? Authorizing Provider  atenolol (TENORMIN) 50 MG tablet Take 1 tablet (50 mg total) by mouth daily. 09/06/17  Yes Brayton Caves, PA-C  aspirin EC 81 MG tablet Take 1 tablet (81 mg total) by mouth daily. 09/06/17   Brayton Caves, PA-C  Blood Glucose Monitoring Suppl (TRUE METRIX METER) DEVI 1 kit by Does not apply route 4 (four) times daily. 09/06/17   Brayton Caves, PA-C  glucose blood (TRUE METRIX BLOOD GLUCOSE TEST) test strip Use as instructed 09/06/17   Brayton Caves, PA-C  Insulin Glargine (LANTUS SOLOSTAR) 100 UNIT/ML Solostar Pen Inject 20 Units into the skin daily at 10 pm. 09/06/17   Brayton Caves, PA-C  methimazole (TAPAZOLE) 5 MG tablet Take 7.5 mg by mouth 2 (two) times daily.    [provider]  simvastatin (ZOCOR) 20 MG tablet Take 20 mg by mouth daily at 6 PM.    [provider]  TRUEPLUS LANCETS 28G MISC 28 g by Does not apply route 4 (four) times daily. 09/06/17   Brayton Caves, PA-C  valsartan-hydrochlorothiazide (DIOVAN-HCT) 320-25 MG tablet Take 1 tablet by mouth daily. 09/06/17   Brayton Caves, PA-C   Family-+DM and HTN  Social-nonsmoker, lives here in [place]  Objective:   Vitals:   09/06/17 1115  BP: (!) 184/89  Pulse: 80  Temp: 97.6 F (36.4 C)  TempSrc: Oral  SpO2: 97%  Weight: 213 lb 12.8 oz (97 kg)  Height: 5' 8"  (1.727 m)    Exam General appearance : Awake, alert, not in any distress. Speech Clear. Not toxic looking HEENT: Atraumatic and Normocephalic, pupils equally reactive to light and accomodation; left eye lag  with exophthalmos Neck: supple, no JVD. No cervical lymphadenopathy.  Chest:Good air entry bilaterally, 1/6 murmur  CVS: S1 S2 regular, no murmurs.  Abdomen: Bowel sounds present, Non tender and not distended with no guarding, rigidity or rebound. Extremities: B/L Lower Ext shows no edema, both legs are warm to touch Neurology: Awake alert, and oriented X 3, CN II-XII intact, Non focal  Data Review Lab Results  Component Value Date   HGBA1C (H) 01/26/2008    9.1 (NOTE)   The ADA recommends the following therapeutic goal for glycemic   control related to Hgb A1C measurement:   Goal of Therapy:   < 7.0% Hgb A1C   Reference: American Diabetes Association: Clinical Practice   Recommendations 2008, Diabetes Care,  2008, 31:(Suppl 1).     Assessment & Plan  1. DM 2 - uncontrolled  -Start Lantus 20 U qhs  -Diabetes Education  -Meter/kit; keep a log  -check A1C 2. HTN  -cont BB   -Add ARB/HCTZ combo  -low salt diet 3. Hx Thyroid dz  -check TSH, T3, T4  -restart Tapazole if indicated  PharmD assisted with education today Return in about 2 weeks (around 09/20/2017).  The patient was given clear instructions to go to ER or return to medical center if symptoms don't improve, worsen or new problems develop. The patient verbalized understanding. The patient was told to call to get lab results if they haven't heard anything in the next week.   Total time spent with patient was 50 min). Greater than 50 % of this visit was spent face to face counseling and coordinating care regarding risk factor modification, compliance importance and encouragement, education related to Diabetes and HTN and other chronic problems.  This note has been created with Surveyor, quantity. Any transcriptional errors are unintentional.    ADDENDUM: when pt went to get her scripts from pharmacy. She told them that suddenly "I don't feel good". She returned to an exam room and  we gave 8 U insulin. Observed 1 hour post administration with some sx improvement.   Zettie Pho, PA-C [facility] [place], [place]   09/06/2017, 11:46 AM

## 2017-09-07 LAB — BASIC METABOLIC PANEL
BUN / CREAT RATIO: 16 (ref 12–28)
BUN: 17 mg/dL (ref 8–27)
CO2: 20 mmol/L (ref 20–29)
CREATININE: 1.04 mg/dL — AB (ref 0.57–1.00)
Calcium: 10.1 mg/dL (ref 8.7–10.3)
Chloride: 104 mmol/L (ref 96–106)
GFR calc Af Amer: 63 mL/min/{1.73_m2} (ref >59)
GFR calc non Af Amer: 55 mL/min/{1.73_m2} — ABNORMAL LOW (ref >59)
GLUCOSE: 485 mg/dL — AB (ref 65–99)
Potassium: 4.3 mmol/L (ref 3.5–5.2)
Sodium: 140 mmol/L (ref 134–144)

## 2017-09-07 LAB — T3, FREE: T3, Free: 5.7 pg/mL — ABNORMAL HIGH (ref 2.0–4.4)

## 2017-09-07 LAB — HEMOGLOBIN A1C
Est. average glucose Bld gHb Est-mCnc: 355 mg/dL
HEMOGLOBIN A1C: 14 % — AB (ref 4.8–5.6)

## 2017-09-07 LAB — TSH: TSH: <0.006 u[IU]/mL — ABNORMAL LOW (ref 0.450–4.500)

## 2017-09-07 LAB — T4, FREE: Free T4: 2.26 ng/dL — ABNORMAL HIGH (ref 0.82–1.77)

## 2017-09-13 DIAGNOSIS — H903 Sensorineural hearing loss, bilateral: Secondary | ICD-10-CM | POA: Diagnosis not present

## 2017-09-13 DIAGNOSIS — H9313 Tinnitus, bilateral: Secondary | ICD-10-CM | POA: Diagnosis not present

## 2017-09-18 ENCOUNTER — Encounter: Payer: Self-pay | Admitting: Skilled Nursing Facility1

## 2017-09-18 ENCOUNTER — Encounter: Payer: Medicare Other | Attending: Physician Assistant | Admitting: Skilled Nursing Facility1

## 2017-09-18 DIAGNOSIS — Z713 Dietary counseling and surveillance: Secondary | ICD-10-CM | POA: Diagnosis not present

## 2017-09-18 DIAGNOSIS — E119 Type 2 diabetes mellitus without complications: Secondary | ICD-10-CM

## 2017-09-18 DIAGNOSIS — E118 Type 2 diabetes mellitus with unspecified complications: Secondary | ICD-10-CM | POA: Diagnosis not present

## 2017-09-18 NOTE — Progress Notes (Signed)
Pt states she takes her insulin but she is not happy about it. Pt states she stays very tired. Pt states her ears ring all the time. Pt states she stopped eating sweets. Pt states her toes hurt a lot. Pt states her grandson lives with her and finances are tight pt states she wants her grandson to move out he is 70 years old (he pays half the rent). Pt states she sometimes forgets to eat. Pt states she likes fried chicken and fish. Pt states she had a palsy stroke years ago and has diabetes since then. Pt states she now chooses peanuts over candy.  Diabetes Self-Management Education  Visit Type: First/Initial   09/18/2017  Ms. [employer], identified by name and date of birth, is a 72 y.o. female with a diagnosis of Diabetes: Type 2.   ASSESSMENT  There were no vitals taken for this visit. There is no height or weight on file to calculate BMI.  Diabetes Self-Management Education - 09/18/17 1153      Visit Information   Visit Type  First/Initial      Initial Visit   Diabetes Type  Type 2    Are you currently following a meal plan?  No    Are you taking your medications as prescribed?  Yes      Health Coping   How would you rate your overall health?  Fair      Psychosocial Assessment   Patient Belief/Attitude about Diabetes  Motivated to manage diabetes      Pre-Education Assessment   Patient understands the diabetes disease and treatment process.  Needs Instruction    Patient understands incorporating nutritional management into lifestyle.  Needs Instruction    Patient undertands incorporating physical activity into lifestyle.  Needs Instruction    Patient understands using medications safely.  Needs Instruction    Patient understands monitoring blood glucose, interpreting and using results  Needs Instruction    Patient understands prevention, detection, and treatment of acute complications.  Needs Instruction    Patient understands prevention, detection, and treatment of chronic  complications.  Needs Instruction    Patient understands how to develop strategies to address psychosocial issues.  Needs Instruction    Patient understands how to develop strategies to promote health/change behavior.  Needs Instruction      Complications   Last HgB A1C per patient/outside source  14 %    How often do you check your blood sugar?  0 times/day (not testing)    Have you had a dilated eye exam in the past 12 months?  No    Have you had a dental exam in the past 12 months?  No    Are you checking your feet?  No      Dietary Intake   Dinner  fried chicken and fish    Beverage(s)  water      Exercise   Exercise Type  ADL's      Patient Education   Previous Diabetes Education  No    Disease state   Factors that contribute to the development of diabetes    Nutrition management   Food label reading, portion sizes and measuring food.    Physical activity and exercise   Role of exercise on diabetes management, blood pressure control and cardiac health.    Monitoring  Taught/evaluated SMBG meter.;Purpose and frequency of SMBG.;Yearly dilated eye exam;Daily foot exams    Acute complications  Taught treatment of hypoglycemia - the 15 rule.;Discussed and identified  patients' treatment of hyperglycemia.    Chronic complications  Dental care;Retinopathy and reason for yearly dilated eye exams    Psychosocial adjustment  Role of stress on diabetes      Individualized Goals (developed by patient)   Nutrition  Follow meal plan discussed;Adjust meds/carbs with exercise as discussed    Physical Activity  Exercise 3-5 times per week;30 minutes per day    Monitoring   test my blood glucose as discussed;test blood glucose pre and post meals as discussed    Reducing Risk  do foot checks daily;treat hypoglycemia with 15 grams of carbs if blood glucose less than 70mg /dL      Post-Education Assessment   Patient understands the diabetes disease and treatment process.  Demonstrates understanding  / competency    Patient understands incorporating nutritional management into lifestyle.  Demonstrates understanding / competency    Patient undertands incorporating physical activity into lifestyle.  Demonstrates understanding / competency    Patient understands using medications safely.  Demonstrates understanding / competency    Patient understands monitoring blood glucose, interpreting and using results  Demonstrates understanding / competency    Patient understands prevention, detection, and treatment of acute complications.  Demonstrates understanding / competency    Patient understands prevention, detection, and treatment of chronic complications.  Demonstrates understanding / competency    Patient understands how to develop strategies to address psychosocial issues.  Demonstrates understanding / competency    Patient understands how to develop strategies to promote health/change behavior.  Demonstrates understanding / competency      Outcomes   Expected Outcomes  Demonstrated interest in learning. Expect positive outcomes    Future DMSE  PRN    Program Status  Completed       Individualized Plan for Diabetes Self-Management Training:   Learning Objective:  Patient will have a greater understanding of diabetes self-management. Patient education plan is to attend individual and/or group sessions per assessed needs and concerns.   Plan:   Patient Instructions  -Always bring your meter with you everywhere you go -Always Properly dispose of your needles:  -Discard in a hard plastic/metal container with a lid (something the needle can't puncture)  -Write Do Not Recycle on the outside of the container  -Example: A laundry detergent bottle -Never use the same needle more than once -Eat 2-3 carbohydrate choices for each meal and 1 for each snack -A meal: carbohydrates, protein, vegetable -A snack: A Fruit OR Vegetable AND Protein  -Always pay attention to your body keeping watchful  of possible low blood sugar (below 70) or high blood sugar (above 200)  -Check your feet every day looking for anything that was not there the day before  -Call your doctor for more insulin  -Get back into the gym: check your blood sugar before going to the gym  -Only eat fried foods once in 7 days  -Keep a log of your blood sugars: write down what you ate that day if they are out of range  -Check your blood sugar 2 times a day: once in the morning before you eat and once 2 hours after a meal   Expected Outcomes:  Demonstrated interest in learning. Expect positive outcomes  Education material provided: Meal plan card, My Plate and Snack sheet  If problems or questions, patient to contact team via:  Phone  Future DSME appointment: PRN

## 2017-09-18 NOTE — Patient Instructions (Addendum)
-  Always bring your meter with you everywhere you go -Always Properly dispose of your needles:  -Discard in a hard plastic/metal container with a lid (something the needle can't puncture)  -Write Do Not Recycle on the outside of the container  -Example: A laundry detergent bottle -Never use the same needle more than once -Eat 2-3 carbohydrate choices for each meal and 1 for each snack -A meal: carbohydrates, protein, vegetable -A snack: A Fruit OR Vegetable AND Protein  -Always pay attention to your body keeping watchful of possible low blood sugar (below 70) or high blood sugar (above 200)  -Check your feet every day looking for anything that was not there the day before  -Call your doctor for more insulin  -Get back into the gym: check your blood sugar before going to the gym  -Only eat fried foods once in 7 days  -Keep a log of your blood sugars: write down what you ate that day if they are out of range  -Check your blood sugar 2 times a day: once in the morning before you eat and once 2 hours after a meal

## 2017-09-19 ENCOUNTER — Telehealth: Payer: Self-pay | Admitting: *Deleted

## 2017-09-19 NOTE — Telephone Encounter (Addendum)
Unable to reach patient. Left message on voicemail to return call. Busy signal at grandson number for second contact.    Notes recorded by Guy FrancoBenjamin, Magic Mohler, RN on 09/16/2017 at 2:16 PM EST Left message on voicemail to return call. ------  Notes recorded by Vivianne MasterNoel, Tiffany S, PA-C on 09/09/2017 at 4:46 PM EST Her TSH is very low, she needs to restart her Tapazole 7.5 mg BID. If she needs this meds, pls send to pharmacy for her. Confirm she has early follow up with a PCP. Thanks.

## 2017-09-20 ENCOUNTER — Other Ambulatory Visit: Payer: Self-pay | Admitting: Pharmacist

## 2017-09-20 MED ORDER — INSULIN GLARGINE 100 UNIT/ML SOLOSTAR PEN: 25.0000 [IU] | PEN_INJECTOR | Freq: Every day | SUBCUTANEOUS | 0 refills | Status: DC

## 2017-09-20 MED FILL — !LANTUS SOLOSTAR 100UNITS/M: 100 | 12 days supply | Qty: 3 | Fill #0

## 2017-09-20 NOTE — Telephone Encounter (Signed)
Patient went to the pharmacy for Lantus refill but she cannot get a refill because it is too early.  She was prescribed 20 units daily but has been taking 20 units BID so she is out of Lantus now. She titrated up because her blood sugars continued to be elevated on 20 units daily. She reports blood sugars in the 60s-70s by taking Lantus 20 units BID.She needs to be further evaluated as I think 20 units BID is too much. Will increase Lantus to 25 units daily so she is able to get insulin to last her until visit and she can follow up with PCP early next week.

## 2017-09-23 ENCOUNTER — Telehealth: Payer: Self-pay | Admitting: *Deleted

## 2017-09-23 NOTE — Telephone Encounter (Signed)
Busy signal from for phone number to grandson, [phone] to reach.   Notes recorded by Guy FrancoBenjamin, Jannice Beitzel, RN on 09/23/2017 at 12:25 PM EST Unable to reach patient. Left message on voicemail to return call. Also left message on voicemail ------  Notes recorded by Guy FrancoBenjamin, Kyler Germer, RN on 09/16/2017 at 2:16 PM EST Left message on voicemail to return call. ------  Notes recorded by Vivianne MasterNoel, Tiffany S, PA-C on 09/09/2017 at 4:46 PM EST Her TSH is very low, she needs to restart her Tapazole 7.5 mg BID. If she needs this meds, pls send to pharmacy for her. Confirm she has early follow up with a PCP. Thanks

## 2017-09-25 ENCOUNTER — Ambulatory Visit: Payer: Medicare Other | Admitting: Nurse Practitioner

## 2017-09-25 DIAGNOSIS — E119 Type 2 diabetes mellitus without complications: Secondary | ICD-10-CM | POA: Insufficient documentation

## 2017-10-03 ENCOUNTER — Other Ambulatory Visit (HOSPITAL_COMMUNITY)
Admission: RE | Admit: 2017-10-03 | Discharge: 2017-10-03 | Disposition: A | Discharge status: Home / Self Care | Payer: Medicare Other | Source: Ambulatory Visit | Attending: Family Medicine | Admitting: Family Medicine

## 2017-10-03 ENCOUNTER — Ambulatory Visit: Payer: Medicare Other | Attending: Family Medicine | Admitting: Physician Assistant

## 2017-10-03 VITALS — BP 147/71 | HR 72 | Temp 98.2°F | Resp 18 | Ht 68.0 in | Wt 210.0 lb

## 2017-10-03 DIAGNOSIS — N390 Urinary tract infection, site not specified: Secondary | ICD-10-CM | POA: Insufficient documentation

## 2017-10-03 DIAGNOSIS — E1165 Type 2 diabetes mellitus with hyperglycemia: Secondary | ICD-10-CM | POA: Diagnosis not present

## 2017-10-03 DIAGNOSIS — E119 Type 2 diabetes mellitus without complications: Secondary | ICD-10-CM

## 2017-10-03 DIAGNOSIS — I10 Essential (primary) hypertension: Secondary | ICD-10-CM | POA: Insufficient documentation

## 2017-10-03 DIAGNOSIS — Z79899 Other long term (current) drug therapy: Secondary | ICD-10-CM | POA: Diagnosis not present

## 2017-10-03 DIAGNOSIS — R739 Hyperglycemia, unspecified: Secondary | ICD-10-CM

## 2017-10-03 DIAGNOSIS — R319 Hematuria, unspecified: Secondary | ICD-10-CM | POA: Diagnosis not present

## 2017-10-03 DIAGNOSIS — Z7982 Long term (current) use of aspirin: Secondary | ICD-10-CM | POA: Insufficient documentation

## 2017-10-03 LAB — POCT URINALYSIS DIPSTICK
BILIRUBIN UA: NEGATIVE
Glucose, UA: 500
KETONES UA: NEGATIVE
Leukocytes, UA: NEGATIVE
NITRITE UA: POSITIVE
PH UA: 5.5 (ref 5.0–8.0)
PROTEIN UA: 30
SPEC GRAV UA: 1.015 (ref 1.010–1.025)
UROBILINOGEN UA: 0.2 U/dL

## 2017-10-03 LAB — GLUCOSE, POCT (MANUAL RESULT ENTRY)
POC Glucose: 406 mg/dl — AB (ref 70–99)
POC Glucose: 366 mg/dl — AB (ref 70–99)

## 2017-10-03 MED ORDER — FLUCONAZOLE 150 MG PO TABS: ORAL_TABLET | ORAL | 0 refills | Status: DC

## 2017-10-03 MED ORDER — METHIMAZOLE 5 MG PO TABS: 7.5000 mg | ORAL_TABLET | Freq: Two times a day (BID) | ORAL | 3 refills | Status: DC

## 2017-10-03 MED ORDER — NITROFURANTOIN MONOHYD MACRO 100 MG PO CAPS: 100.0000 mg | ORAL_CAPSULE | Freq: Two times a day (BID) | ORAL | 0 refills | Status: DC

## 2017-10-03 MED ORDER — SODIUM CHLORIDE 0.9 % IV BOLUS (SEPSIS)
1000.0000 mL | Freq: Once | INTRAVENOUS | Status: AC
  Administered 2017-10-03: 1000 mL via INTRAVENOUS

## 2017-10-03 MED ORDER — INSULIN ASPART 100 UNIT/ML ~~LOC~~ SOLN
20.0000 [IU] | Freq: Once | SUBCUTANEOUS | Status: AC
  Administered 2017-10-03: 20 [IU] via SUBCUTANEOUS

## 2017-10-03 MED ORDER — INSULIN GLARGINE 100 UNIT/ML SOLOSTAR PEN: 30.0000 [IU] | PEN_INJECTOR | Freq: Every day | SUBCUTANEOUS | 0 refills | Status: DC

## 2017-10-03 MED FILL — NITROFURANTOIN MONO-MCR 100: 100 | 5 days supply | Qty: 10 | Fill #0

## 2017-10-03 MED FILL — methIMAzole 5 MG TABS: 5 | 15 days supply | Qty: 45 | Fill #0

## 2017-10-03 MED FILL — FLUCONAZOLE 150 MG TABLET: 150 | 2 days supply | Qty: 2 | Fill #0

## 2017-10-03 MED FILL — LANTUS SOLOSTAR 100 UNITS/M: 100 | 10 days supply | Qty: 3 | Fill #0

## 2017-10-03 NOTE — Progress Notes (Signed)
Patient ID: Wendy Allison, female   DOB: Oct 24, 1945, 72 y.o.   MRN: [id number]   [facility], is a 72 y.o. female  ZES:[id number]  [id number]  DOB - 12-Jul-1946  Subjective:  Chief Complaint and HPI: Wendy Allison is a 72 y.o. female here today for urinary incontinence, frequency and burning after urination.  It is difficult to say how long the symptoms have been going on.  She says on and off for a year but worse over the last few weeks.  No f/c.  Overall poor historian.    She says she takes her diabetes meds as prescribed.  She is not checking her blood sugars at home.    ROS:   Constitutional:  No f/c, No night sweats, No unexplained weight loss. EENT:  No vision changes, No blurry vision, No hearing changes. No mouth, throat, or ear problems.  Respiratory: No cough, No SOB Cardiac: No CP, no palpitations GI:  No abd pain, No N/V/D. GU: + Urinary s/sx Musculoskeletal: No joint pain Neuro: No headache, no dizziness, no motor weakness.  Skin: No rash Endocrine:  No polydipsia. + polyuria.  Psych: Denies SI/HI  No problems updated.  ALLERGIES: No Known Allergies  PAST MEDICAL HISTORY: Past Medical History:  Diagnosis Date  . Diabetes mellitus without complication ([place])   . Hypertension   . Stroke [facility])     MEDICATIONS AT HOME: Prior to Admission medications   Medication Sig Start Date End Date Taking? Authorizing Provider  aspirin EC 81 MG tablet Take 1 tablet (81 mg total) by mouth daily. 09/06/17  Yes Ena Dawley, Tiffany S, PA-C  atenolol (TENORMIN) 50 MG tablet Take 1 tablet (50 mg total) by mouth daily. 09/06/17  Yes Ena Dawley, Tiffany S, PA-C  Blood Glucose Monitoring Suppl (TRUE METRIX METER) DEVI 1 kit by Does not apply route 4 (four) times daily. 09/06/17  Yes Ena Dawley, Tiffany S, PA-C  glucose blood (TRUE METRIX BLOOD GLUCOSE TEST) test strip Use as instructed 09/06/17  Yes Ena Dawley, Tiffany S, PA-C  Insulin Glargine (LANTUS SOLOSTAR) 100 UNIT/ML Solostar Pen Inject 30 Units into the  skin daily at 10 pm. 10/03/17  Yes McClung, Dionne Bucy, PA-C  losartan-hydrochlorothiazide (HYZAAR) 100-12.5 MG tablet Take 1 tablet by mouth daily. 09/06/17  Yes Ena Dawley, Tiffany S, PA-C  methimazole (TAPAZOLE) 5 MG tablet Take 7.5 mg by mouth 2 (two) times daily.   Yes [provider]  simvastatin (ZOCOR) 20 MG tablet Take 20 mg by mouth daily at 6 PM.   Yes [provider]  TRUEPLUS LANCETS 28G MISC 28 g by Does not apply route 4 (four) times daily. 09/06/17  Yes Ena Dawley, Tiffany S, PA-C  fluconazole (DIFLUCAN) 150 MG tablet Take 1 now and one after you finish your antibiotics 10/03/17   Argentina Donovan, PA-C  nitrofurantoin, macrocrystal-monohydrate, (MACROBID) 100 MG capsule Take 1 capsule (100 mg total) by mouth 2 (two) times daily. 10/03/17   Argentina Donovan, PA-C     Objective:  EXAM:   Vitals:   10/03/17 1454  BP: (!) 147/71  Pulse: 72  Resp: 18  Temp: 98.2 F (36.8 C)  TempSrc: Oral  SpO2: 95%  Weight: 210 lb (95.3 kg)  Height: 5' 8"  (1.727 m)    General appearance : A&OX3. NAD. Non-toxic-appearing HEENT: Atraumatic and Normocephalic.  PERRLA. EOM intact.   Neck: supple, no JVD. No cervical lymphadenopathy. No thyromegaly Chest/Lungs:  Breathing-non-labored, Good air entry bilaterally, breath sounds normal without rales, rhonchi, or wheezing  CVS: S1  S2 regular, no murmurs, gallops, rubs  nO cva ttp Extremities: Bilateral Lower Ext shows no edema, both legs are warm to touch with = pulse throughout Neurology:  CN II-XII grossly intact, Non focal.   Psych:  TP linear. J/I WNL. Normal speech. Appropriate eye contact and affect.  Skin:  No Rash  Data Review Lab Results  Component Value Date   HGBA1C 14.0 (H) 09/06/2017   HGBA1C (H) 01/26/2008    9.1 (NOTE)   The ADA recommends the following therapeutic goal for glycemic   control related to Hgb A1C measurement:   Goal of Therapy:   < 7.0% Hgb A1C   Reference: American Diabetes Association: Clinical Practice    Recommendations 2008, Diabetes Care,  2008, 31:(Suppl 1).     Assessment & Plan   1. Urinary tract infection with hematuria, site unspecified Increase water intake.  Urinary hygiene reviewed - Urinalysis Dipstick - Urine Culture - Urine cytology ancillary only - nitrofurantoin, macrocrystal-monohydrate, (MACROBID) 100 MG capsule; Take 1 capsule (100 mg total) by mouth 2 (two) times daily.  Dispense: 10 capsule; Refill: 0 - fluconazole (DIFLUCAN) 150 MG tablet; Take 1 now and one after you finish your antibiotics  Dispense: 2 tablet; Refill: 0 - sodium chloride 0.9 % bolus 1,000 mL  2. Diabetes mellitus without complication (HCC) Uncontrolled.  Compliance is imperative.  Take all meds as directed.  Discussed this at length with her.   - insulin aspart (novoLOG) injection 20 Units now, blood sugar came down to 366.   - Glucose (CBG) Increase dose- Insulin Glargine (LANTUS SOLOSTAR) 100 UNIT/ML Solostar Pen; Inject 30 Units into the skin daily at 10 pm.  Dispense: 1 pen; Refill: 0 - sodium chloride 0.9 % bolus 1,000 mL - Glucose (CBG)  3. Hyperglycemia - sodium chloride 0.9 % bolus 1,000 mL - Glucose (CBG)  4. Hypertension, unspecified type Take medications as prescribed We have discussed target BP range and blood pressure goal. I have advised patient to check BP regularly and to call us back or report to clinic if the numbers are consistently higher than 140/90. We discussed the importance of compliance with medical therapy and DASH diet recommended, consequences of uncontrolled hypertension discussed.        Patient have been counseled extensively about nutrition and exercise  Return for keep 10/23/2017 for establishing care and follow-up uncontrolled DM.  The patient was given clear instructions to go to ER or return to medical center if symptoms don't improve, worsen or new problems develop. The patient verbalized understanding. The patient was told to call to get lab results  if they haven't heard anything in the next week.     Freeman Caldron, PA-C [facility] [place], [place]   10/03/2017, 3:37 PM

## 2017-10-03 NOTE — Patient Instructions (Addendum)
Check blood sugar fasting and at meal times and record and bring to your next visit.     Urinary Tract Infection, Adult A urinary tract infection (UTI) is an infection of any part of the urinary tract. The urinary tract includes the:  Kidneys.  Ureters.  Bladder.  Urethra.  These organs make, store, and get rid of pee (urine) in the body. Follow these instructions at home:  Take over-the-counter and prescription medicines only as told by your doctor.  If you were prescribed an antibiotic medicine, take it as told by your doctor. Do not stop taking the antibiotic even if you start to feel better.  Avoid the following drinks: ? Alcohol. ? Caffeine. ? Tea. ? Carbonated drinks.  Drink enough fluid to keep your pee clear or pale yellow.  Keep all follow-up visits as told by your doctor. This is important.  Make sure to: ? Empty your bladder often and completely. Do not to hold pee for long periods of time. ? Empty your bladder before and after sex. ? Wipe from front to back after a bowel movement if you are female. Use each tissue one time when you wipe. Contact a doctor if:  You have back pain.  You have a fever.  You feel sick to your stomach (nauseous).  You throw up (vomit).  Your symptoms do not get better after 3 days.  Your symptoms go away and then come back. Get help right away if:  You have very bad back pain.  You have very bad lower belly (abdominal) pain.  You are throwing up and cannot keep down any medicines or water. This information is not intended to replace advice given to you by your health care provider. Make sure you discuss any questions you have with your health care provider. Document Released: 12/26/2007 Document Revised: 12/15/2015 Document Reviewed: 05/30/2015 Elsevier Interactive Patient Education  [employer]2018 Elsevier Inc.

## 2017-10-04 LAB — URINE CYTOLOGY ANCILLARY ONLY
CHLAMYDIA, DNA PROBE: NEGATIVE
NEISSERIA GONORRHEA: NEGATIVE
Trichomonas: NEGATIVE

## 2017-10-06 LAB — URINE CULTURE

## 2017-10-07 ENCOUNTER — Telehealth (INDEPENDENT_AMBULATORY_CARE_PROVIDER_SITE_OTHER): Payer: Self-pay | Admitting: *Deleted

## 2017-10-07 LAB — URINE CYTOLOGY ANCILLARY ONLY
Bacterial vaginitis: NEGATIVE
Candida vaginitis: NEGATIVE

## 2017-10-07 NOTE — Telephone Encounter (Signed)
-----   Message from Anders SimmondsAngela M McClung, [place]PA-C sent at 10/07/2017  1:26 PM EDT ----- Please call patient.  The urinary cultures showed infection but no STD.  The antibiotics I out her on should clear up the infection. Follow-up as planned. Thanks, Georgian CoAngela McClung, PA-C

## 2017-10-07 NOTE — Telephone Encounter (Signed)
Medical Assistant left message on patient's home and cell voicemail. Voicemail states to [place] [facility] back [facility] [place] [facility] [phone]. !!!Please inform patient of no STD being noted, but an infection was present. Patient needs to complete the medication that she was prescribed in office!!!

## 2017-10-16 ENCOUNTER — Ambulatory Visit: Payer: Medicare Other | Admitting: Skilled Nursing Facility1

## 2017-10-23 ENCOUNTER — Ambulatory Visit: Payer: Medicare Other | Admitting: Nurse Practitioner

## 2017-10-28 ENCOUNTER — Ambulatory Visit: Payer: Medicare Other | Attending: Nurse Practitioner | Admitting: Nurse Practitioner

## 2017-10-28 ENCOUNTER — Encounter: Payer: Self-pay | Admitting: Nurse Practitioner

## 2017-10-28 ENCOUNTER — Other Ambulatory Visit: Payer: Self-pay | Admitting: Physician Assistant

## 2017-10-28 ENCOUNTER — Other Ambulatory Visit (HOSPITAL_COMMUNITY)
Admission: RE | Admit: 2017-10-28 | Discharge: 2017-10-28 | Disposition: A | Discharge status: Home / Self Care | Payer: Medicare Other | Source: Ambulatory Visit | Attending: Nurse Practitioner | Admitting: Nurse Practitioner

## 2017-10-28 VITALS — BP 174/98 | HR 91 | Temp 98.2°F | Ht 68.0 in | Wt 200.2 lb

## 2017-10-28 DIAGNOSIS — Z9111 Patient's noncompliance with dietary regimen: Secondary | ICD-10-CM | POA: Diagnosis not present

## 2017-10-28 DIAGNOSIS — Z794 Long term (current) use of insulin: Secondary | ICD-10-CM

## 2017-10-28 DIAGNOSIS — E785 Hyperlipidemia, unspecified: Secondary | ICD-10-CM | POA: Diagnosis not present

## 2017-10-28 DIAGNOSIS — E118 Type 2 diabetes mellitus with unspecified complications: Secondary | ICD-10-CM

## 2017-10-28 DIAGNOSIS — Z79899 Other long term (current) drug therapy: Secondary | ICD-10-CM | POA: Diagnosis not present

## 2017-10-28 DIAGNOSIS — Z8673 Personal history of transient ischemic attack (TIA), and cerebral infarction without residual deficits: Secondary | ICD-10-CM | POA: Insufficient documentation

## 2017-10-28 DIAGNOSIS — Z780 Asymptomatic menopausal state: Secondary | ICD-10-CM | POA: Diagnosis not present

## 2017-10-28 DIAGNOSIS — E1165 Type 2 diabetes mellitus with hyperglycemia: Secondary | ICD-10-CM | POA: Insufficient documentation

## 2017-10-28 DIAGNOSIS — E119 Type 2 diabetes mellitus without complications: Secondary | ICD-10-CM

## 2017-10-28 DIAGNOSIS — H539 Unspecified visual disturbance: Secondary | ICD-10-CM | POA: Diagnosis not present

## 2017-10-28 DIAGNOSIS — L292 Pruritus vulvae: Secondary | ICD-10-CM | POA: Diagnosis not present

## 2017-10-28 DIAGNOSIS — N3941 Urge incontinence: Secondary | ICD-10-CM | POA: Diagnosis not present

## 2017-10-28 DIAGNOSIS — I10 Essential (primary) hypertension: Secondary | ICD-10-CM | POA: Diagnosis not present

## 2017-10-28 DIAGNOSIS — Z9114 Patient's other noncompliance with medication regimen: Secondary | ICD-10-CM | POA: Insufficient documentation

## 2017-10-28 DIAGNOSIS — Z7982 Long term (current) use of aspirin: Secondary | ICD-10-CM | POA: Insufficient documentation

## 2017-10-28 DIAGNOSIS — E11649 Type 2 diabetes mellitus with hypoglycemia without coma: Secondary | ICD-10-CM | POA: Insufficient documentation

## 2017-10-28 DIAGNOSIS — R399 Unspecified symptoms and signs involving the genitourinary system: Secondary | ICD-10-CM

## 2017-10-28 DIAGNOSIS — N898 Other specified noninflammatory disorders of vagina: Secondary | ICD-10-CM | POA: Diagnosis not present

## 2017-10-28 LAB — POCT URINALYSIS DIPSTICK
BILIRUBIN UA: NEGATIVE
Glucose, UA: 500
Ketones, UA: NEGATIVE
LEUKOCYTES UA: NEGATIVE
NITRITE UA: NEGATIVE
PH UA: 5.5 (ref 5.0–8.0)
RBC UA: NEGATIVE
Spec Grav, UA: 1.015 (ref 1.010–1.025)
UROBILINOGEN UA: 0.2 U/dL

## 2017-10-28 LAB — GLUCOSE, POCT (MANUAL RESULT ENTRY): POC GLUCOSE: 352 mg/dL — AB (ref 70–99)

## 2017-10-28 MED ORDER — GLIMEPIRIDE 4 MG PO TABS: 4.0000 mg | ORAL_TABLET | Freq: Every day | ORAL | 3 refills | Status: AC

## 2017-10-28 MED ORDER — ATENOLOL 50 MG PO TABS: 50.0000 mg | ORAL_TABLET | Freq: Every day | ORAL | 0 refills | Status: AC

## 2017-10-28 MED FILL — LANTUS SOLOSTAR 100 UNITS/M: 100 | 10 days supply | Qty: 3 | Fill #0

## 2017-10-28 MED FILL — methIMAzole 5 MG TABS: 5 | 15 days supply | Qty: 45 | Fill #1

## 2017-10-28 MED FILL — ATENOLOL 50 MG TABLET: 50 | 30 days supply | Qty: 30 | Fill #0

## 2017-10-28 MED FILL — GLIMEPIRIDE 4 MG TABLET: 4 | 30 days supply | Qty: 30 | Fill #0

## 2017-10-28 MED FILL — LOSARTAN-HCTZ 100-12.5 MG T: 100-12.5 | 30 days supply | Qty: 30 | Fill #1

## 2017-10-28 NOTE — Patient Instructions (Signed)
Urinary Incontinence Urinary incontinence is the involuntary loss of urine from your bladder. What are the causes? There are many causes of urinary incontinence. They include:  Medicines.  Infections.  Prostatic enlargement, leading to overflow of urine from your bladder.  Surgery.  Neurological diseases.  Emotional factors.  What are the signs or symptoms? Urinary Incontinence can be divided into four types: 1. Urge incontinence. Urge incontinence is the involuntary loss of urine before you have the opportunity to go to the bathroom. There is a sudden urge to void but not enough time to reach a bathroom. 2. Stress incontinence. Stress incontinence is the sudden loss of urine with any activity that forces urine to pass. It is commonly caused by anatomical changes to the pelvis and sphincter areas of your body. 3. Overflow incontinence. Overflow incontinence is the loss of urine from an obstructed opening to your bladder. This results in a backup of urine and a resultant buildup of pressure within the bladder. When the pressure within the bladder exceeds the closing pressure of the sphincter, the urine overflows, which causes incontinence, similar to water overflowing a dam. 4. Total incontinence. Total incontinence is the loss of urine as a result of the inability to store urine within your bladder.  How is this diagnosed? Evaluating the cause of incontinence may require:  A thorough and complete medical and obstetric history.  A complete physical exam.  Laboratory tests such as a urine culture and sensitivities.  When additional tests are indicated, they can include:  An ultrasound exam.  Kidney and bladder X-rays.  Cystoscopy. This is an exam of the bladder using a narrow scope.  Urodynamic testing to test the nerve function to the bladder and sphincter areas.  How is this treated? Treatment for urinary incontinence depends on the cause:  For urge incontinence caused  by a bacterial infection, antibiotics will be prescribed. If the urge incontinence is related to medicines you take, your health care provider may have you change the medicine.  For stress incontinence, surgery to re-establish anatomical support to the bladder or sphincter, or both, will often correct the condition.  For overflow incontinence caused by an enlarged prostate, an operation to open the channel through the enlarged prostate will allow the flow of urine out of the bladder. In women with fibroids, a hysterectomy may be recommended.  For total incontinence, surgery on your urinary sphincter may help. An artificial urinary sphincter (an inflatable cuff placed around the urethra) may be required. In women who have developed a hole-like passage between their bladder and vagina (vesicovaginal fistula), surgery to close the fistula often is required.  Follow these instructions at home:  Normal daily hygiene and the use of pads or adult diapers that are changed regularly will help prevent odors and skin damage.  Avoid caffeine. It can overstimulate your bladder.  Use the bathroom regularly. Try about every 2-3 hours to go to the bathroom, even if you do not feel the need to do so. Take time to empty your bladder completely. After urinating, wait a minute. Then try to urinate again.  For causes involving nerve dysfunction, keep a log of the medicines you take and a journal of the times you go to the bathroom. Contact a health care provider if:  You experience worsening of pain instead of improvement in pain after your procedure.  Your incontinence becomes worse instead of better. Get help right away if:  You experience fever or shaking chills.  You are unable to   pass your urine.  You have redness spreading into your groin or down into your thighs. This information is not intended to replace advice given to you by your health care provider. Make sure you discuss any questions you have  with your health care provider. Document Released: 08/16/2004 Document Revised: 02/17/2016 Document Reviewed: 12/16/2012 Elsevier Interactive Patient Education  2018 Elsevier Inc.  

## 2017-10-28 NOTE — Progress Notes (Signed)
Assessment & Plan:  Wendy Allison was seen today for vaginal itching.  Diagnoses and all orders for this visit:  UTI symptoms -     Urinalysis Dipstick -     Ambulatory referral to Urology  Vaginal odor -     Cervicovaginal ancillary only  Diabetes mellitus with complication ([place]) -     Glucose (CBG) -     glimepiride (AMARYL) 4 MG tablet; Take 1 tablet (4 mg total) by mouth daily before breakfast. -     Lipid panel -     CMP14+EGFR -     Ambulatory referral to Ophthalmology  Continue blood sugar control as discussed in office today, low carbohydrate diet, and regular physical exercise as tolerated, 150 minutes per week (30 min each day, 5 days per week, or 50 min 3 days per week). Keep blood sugar logs with fasting goal of 80-130 mg/dl, post prandial less than 180.  For Hypoglycemia: BS <60 and Hyperglycemia BS >400; contact the clinic ASAP. Annual eye exams and foot exams are recommended.   Urge incontinence of urine -     Ambulatory referral to Urology  Essential hypertension -     atenolol (TENORMIN) 50 MG tablet; Take 1 tablet (50 mg total) by mouth daily. Continue all antihypertensives as prescribed.  Remember to bring in your blood pressure log with you for your follow up appointment.  DASH/Mediterranean Diets are healthier choices for HTN.    Patient has been counseled on age-appropriate routine health concerns for screening and prevention. These are reviewed and up-to-date. Referrals have been placed accordingly. Immunizations are up-to-date or declined.    Subjective:   Chief Complaint  Patient presents with  . Vaginal Itching    Pt. have vaginal itching and odor.    HPI [employer] 72 y.o. female presents to office today as a walk in with complaints of urge incontinence and vaginitis. She has a history of poorly controlled diabetes due to lack of medication and diet compliance.    UTI Symptoms She endorses multiple episodes (7-10) of  urge incontinence which  has been ongoing for over a year. She does not wear any protective pads and states she soils her underwear and clothes daily.  She was recently treated for a UTI with macrobid and given diflucan prophylactically. She states she completed both and the UTI symptoms have lessened but she continues to have symptoms of vaginitis with vaginal odor and itching. She denies being sexually active.    Diabetes Mellitus Type 2 Stopped taking metformin due to diarrhea. She is currently only taking lantus 30units at night. She was placed glimepiride 69m today. She endorses visual disturbance (left eye, blurriness). She is not checking her blood sugars routinely.  Lab Results  Component Value Date   HGBA1C 14.0 (H) 09/06/2017    Essential Hypertension She endorses medication compliance. Out of her medications for a while. Poorly controlled today. I am not sure how forthcoming she is in regards to her report of taking her medications as prescribed. Denies chest pain, shortness of breath, palpitations, lightheadedness, dizziness, headaches or BLE edema.  BP Readings from Last 3 Encounters:  10/28/17 (!) 174/98  10/03/17 (!) 147/71  09/06/17 (!) 184/89   Hyperlipidemia   She is medication compliant. She is not diet compliant and denies chest pain, dyspnea, exertional chest pressure/discomfort and tachypnea or statin intolerance including myalgias.  Lab Results  Component Value Date   CHOL 163 10/28/2017   Lab Results  Component Value Date  HDL 49 10/28/2017   Lab Results  Component Value Date   LDLCALC 96 10/28/2017   Lab Results  Component Value Date   TRIG 90 10/28/2017   Lab Results  Component Value Date   CHOLHDL 3.3 10/28/2017    Review of Systems  Constitutional: Negative for fever, malaise/fatigue and weight loss.  HENT: Negative.  Negative for nosebleeds.   Eyes: Positive for blurred vision. Negative for double vision and photophobia.  Respiratory: Negative.  Negative for cough and  shortness of breath.   Cardiovascular: Negative.  Negative for chest pain, palpitations and leg swelling.  Gastrointestinal: Negative.  Negative for heartburn, nausea and vomiting.  Genitourinary: Positive for frequency and urgency. Negative for dysuria, flank pain and hematuria.       SEE HPI  Musculoskeletal: Negative.  Negative for myalgias.  Neurological: Negative.  Negative for dizziness, focal weakness, seizures and headaches.  Psychiatric/Behavioral: Negative.  Negative for suicidal ideas.    Past Medical History:  Diagnosis Date  . Diabetes mellitus without complication ([place])   . Hyperlipidemia   . Hypertension   . Stroke [facility])     History reviewed. No pertinent surgical history.  History reviewed. No pertinent family history.  Social History Reviewed with no changes to be made today.   Outpatient Medications Prior to Visit  Medication Sig Dispense Refill  . Blood Glucose Monitoring Suppl (TRUE METRIX METER) DEVI 1 kit by Does not apply route 4 (four) times daily. 1 Device 0  . glucose blood (TRUE METRIX BLOOD GLUCOSE TEST) test strip Use as instructed 100 each 12  . LANTUS SOLOSTAR 100 UNIT/ML Solostar Pen INJECT 30 UNITS INTO THE SKIN DAILY AT 10 PM. 3 mL 0  . losartan-hydrochlorothiazide (HYZAAR) 100-12.5 MG tablet Take 1 tablet by mouth daily. 90 tablet 3  . methimazole (TAPAZOLE) 5 MG tablet Take 1.5 tablets (7.5 mg total) by mouth 2 (two) times daily. 45 tablet 3  . TRUEPLUS LANCETS 28G MISC 28 g by Does not apply route 4 (four) times daily. 120 each 3  . fluconazole (DIFLUCAN) 150 MG tablet Take 1 now and one after you finish your antibiotics 2 tablet 0  . nitrofurantoin, macrocrystal-monohydrate, (MACROBID) 100 MG capsule Take 1 capsule (100 mg total) by mouth 2 (two) times daily. 10 capsule 0  . aspirin EC 81 MG tablet Take 1 tablet (81 mg total) by mouth daily. (Patient not taking: Reported on 10/28/2017) 150 tablet 0  . simvastatin (ZOCOR) 20 MG tablet Take 20 mg  by mouth daily at 6 PM.    . atenolol (TENORMIN) 50 MG tablet Take 1 tablet (50 mg total) by mouth daily. (Patient not taking: Reported on 10/28/2017) 30 tablet 3   No facility-administered medications prior to visit.     No Known Allergies     Objective:    BP (!) 174/98 (BP Location: Left Arm, Patient Position: Sitting, Cuff Size: Normal)   Pulse 91   Temp 98.2 F (36.8 C) (Oral)   Ht _0  (1.727 m)   Wt 200 lb 3.2 oz (90.8 kg)   SpO2 98%   BMI 30.44 kg/m  Wt Readings from Last 3 Encounters:  10/28/17 200 lb 3.2 oz (90.8 kg)  10/03/17 210 lb (95.3 kg)  09/06/17 213 lb 12.8 oz (97 kg)    Physical Exam  Constitutional: She is oriented to person, place, and time. She appears well-developed and well-nourished. She is cooperative.  HENT:  Head: Normocephalic and atraumatic.  Eyes: EOM are normal.  Left eye droop  Neck: Normal range of motion.  Cardiovascular: Normal rate, regular rhythm, normal heart sounds and intact distal pulses. Exam reveals no gallop and no friction rub.  No murmur heard. Pulmonary/Chest: Effort normal and breath sounds normal. No tachypnea. No respiratory distress. She has no decreased breath sounds. She has no wheezes. She has no rhonchi. She has no rales. She exhibits no tenderness.  Abdominal: Soft. Bowel sounds are normal. She exhibits no distension and no mass. There is no tenderness. There is no rebound and no guarding.  Musculoskeletal: Normal range of motion. She exhibits no edema.  Neurological: She is alert and oriented to person, place, and time. Coordination normal.  Skin: Skin is warm and dry.  Psychiatric: She has a normal mood and affect. Her behavior is normal. Judgment and thought content normal.  Nursing note and vitals reviewed.      Patient has been counseled extensively about nutrition and exercise as well as the importance of adherence with medications and regular follow-up. The patient was given clear instructions to go to ER or  return to medical center if symptoms don't improve, worsen or new problems develop. The patient verbalized understanding.   Follow-up: She has an establish care appt with another provider on 11-28-2017  Gildardo Pounds, FNP-BC [facility] and [place], [place]   10/29/2017, 7:57 PM

## 2017-10-29 ENCOUNTER — Encounter: Payer: Self-pay | Admitting: Nurse Practitioner

## 2017-10-29 LAB — CERVICOVAGINAL ANCILLARY ONLY
BACTERIAL VAGINITIS: NEGATIVE
Candida vaginitis: POSITIVE — AB
Chlamydia: NEGATIVE
Neisseria Gonorrhea: NEGATIVE
Trichomonas: NEGATIVE

## 2017-10-29 LAB — LIPID PANEL
CHOL/HDL RATIO: 3.3 ratio (ref 0.0–4.4)
Cholesterol, Total: 163 mg/dL (ref 100–199)
HDL: 49 mg/dL (ref >39)
LDL Calculated: 96 mg/dL (ref 0–99)
TRIGLYCERIDES: 90 mg/dL (ref 0–149)
VLDL CHOLESTEROL CAL: 18 mg/dL (ref 5–40)

## 2017-10-29 LAB — CMP14+EGFR
A/G RATIO: 1.2 (ref 1.2–2.2)
ALK PHOS: 245 IU/L — AB (ref 39–117)
ALT: 18 IU/L (ref 0–32)
AST: 13 IU/L (ref 0–40)
Albumin: 3.8 g/dL (ref 3.5–4.8)
BILIRUBIN TOTAL: 0.6 mg/dL (ref 0.0–1.2)
BUN/Creatinine Ratio: 13 (ref 12–28)
BUN: 11 mg/dL (ref 8–27)
CHLORIDE: 100 mmol/L (ref 96–106)
CO2: 26 mmol/L (ref 20–29)
Calcium: 10.2 mg/dL (ref 8.7–10.3)
Creatinine, Ser: 0.87 mg/dL (ref 0.57–1.00)
GFR calc non Af Amer: 68 mL/min/{1.73_m2} (ref >59)
GFR, EST AFRICAN AMERICAN: 78 mL/min/{1.73_m2} (ref >59)
GLUCOSE: 344 mg/dL — AB (ref 65–99)
Globulin, Total: 3.3 g/dL (ref 1.5–4.5)
POTASSIUM: 3.3 mmol/L — AB (ref 3.5–5.2)
Sodium: 142 mmol/L (ref 134–144)
Total Protein: 7.1 g/dL (ref 6.0–8.5)

## 2017-10-29 MED ORDER — POTASSIUM CHLORIDE ER 10 MEQ PO TBCR: 10.0000 meq | EXTENDED_RELEASE_TABLET | Freq: Every day | ORAL | 0 refills | Status: DC

## 2017-10-29 MED ORDER — FLUCONAZOLE 150 MG PO TABS: 150.0000 mg | ORAL_TABLET | ORAL | 0 refills | Status: AC

## 2017-10-31 ENCOUNTER — Telehealth: Payer: Self-pay

## 2017-10-31 NOTE — Telephone Encounter (Signed)
CMA attempt to call patient. No answer. Left a VM for patient to call back. If patient call back please inform:   Vaginal sample shows yeast. Will send prescription in to the pharmacy. Please be aware that you are at increased risk of developing yeast infections due to your poorly controlled diabetes. INSTRUCTIONS: Work on a low fat, heart healthy diet and participate in regular aerobic exercise program by working out at least 150 minutes per week. No fried foods. No junk foods, sodas, sugary drinks, unhealthy snacking, alcohol or smoking.  Your potassium is slightly low. Will send in a 4 week supply of potassium for you to take and would like for you to have your labs repeated at your next office visit. Your liver enzymes are elevated which could be due to your high glucose readings. Your numbers should trend down as your diabetes becomes more controlled

## 2017-10-31 NOTE — Telephone Encounter (Signed)
-----   Message from Claiborne RiggZelda W Fleming, NP sent at 10/29/2017  8:07 PM EDT ----- Vaginal sample shows yeast. Will send prescription in to the pharmacy. Please be aware that you are at increased risk of developing yeast infections due to your poorly controlled diabetes. INSTRUCTIONS: Work on a low fat, heart healthy diet and participate in regular aerobic exercise program by working out at least 150 minutes per week. No fried foods. No junk foods, sodas, sugary drinks, unhealthy snacking, alcohol or smoking.  Your potassium is slightly low. Will send in a 4 week supply of potassium for you to take and would like for you to have your labs repeated at your next office visit. Your liver enzymes are elevated which could be due to your high glucose readings. Your numbers should trend down as your diabetes becomes more controlled

## 2017-11-25 IMAGING — NM NM THYROID IMAGING W/ UPTAKE SINGLE (24 HR)
4 series · 4 of 4 positions shown · non-contrast
Comparison: None

CLINICAL DATA: Hyperthyroidism, history stroke and diabetes
mellitus, weight loss, hair loss, weakness, symptoms for 2 years,
abnormal TSH

EXAM:
THYROID SCAN AND UPTAKE - 24 HOURS
TECHNIQUE: Following the per oral administration of 8-NON sodium iodide, the
patient returned at 24 hours and uptake measurements were acquired
with the uptake probe centered on the neck. Thyroid imaging was
performed following the intravenous administration of the 4c-WWm
Pertechnetate.
RADIOPHARMACEUTICALS:  9 MicroCuries 8-NON sodium iodide orally and
9.8 mCi 3echnetium-11m pertechnetate IV

[Series 1: anterior · 3.25mm/px · 1 of 1 slices shown]
[im 1/1]
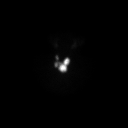

[Series 2: ant w marker · 3.25mm/px · 1 of 1 slices shown]
[im 1/1]
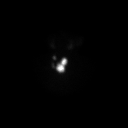

[Series 3: lao · 3.25mm/px · 1 of 1 slices shown]
[im 1/1]
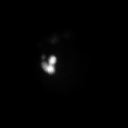

[Series 4: rao · 3.25mm/px · 1 of 1 slices shown]
[im 1/1]
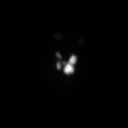

[4 of 4 positions shown; findings below may reference images not displayed]

FINDINGS: 24 hour radio iodine uptake calculated at 66.6%, well above normal
range consistent with hyperthyroidism.

Images demonstrate multiple foci of increased tracer localization
LEFT larger than RIGHT compatible with toxic multinodular goiter
with suppression of uptake in intervening thyroid tissue.
IMPRESSION: Elevated 24 hour radio iodine uptake of 66.6%.

Multiple hot nodules in both thyroid lobes, larger of LEFT.

Findings are consistent with toxic multinodular goiter.

## 2017-11-28 ENCOUNTER — Encounter: Payer: Self-pay | Admitting: Internal Medicine

## 2017-11-28 ENCOUNTER — Ambulatory Visit: Payer: Medicare Other | Attending: Internal Medicine | Admitting: Internal Medicine

## 2017-11-28 VITALS — BP 138/76 | HR 86 | Temp 97.9°F | Resp 18 | Ht 68.0 in | Wt 203.0 lb

## 2017-11-28 DIAGNOSIS — E118 Type 2 diabetes mellitus with unspecified complications: Secondary | ICD-10-CM | POA: Diagnosis not present

## 2017-11-28 DIAGNOSIS — IMO0002 Reserved for concepts with insufficient information to code with codable children: Secondary | ICD-10-CM

## 2017-11-28 DIAGNOSIS — E0590 Thyrotoxicosis, unspecified without thyrotoxic crisis or storm: Secondary | ICD-10-CM

## 2017-11-28 DIAGNOSIS — Z7982 Long term (current) use of aspirin: Secondary | ICD-10-CM | POA: Insufficient documentation

## 2017-11-28 DIAGNOSIS — Z79899 Other long term (current) drug therapy: Secondary | ICD-10-CM | POA: Insufficient documentation

## 2017-11-28 DIAGNOSIS — I10 Essential (primary) hypertension: Secondary | ICD-10-CM

## 2017-11-28 DIAGNOSIS — E1165 Type 2 diabetes mellitus with hyperglycemia: Secondary | ICD-10-CM | POA: Diagnosis not present

## 2017-11-28 DIAGNOSIS — E119 Type 2 diabetes mellitus without complications: Secondary | ICD-10-CM | POA: Insufficient documentation

## 2017-11-28 LAB — GLUCOSE, POCT (MANUAL RESULT ENTRY): POC Glucose: 291 mg/dl — AB (ref 70–99)

## 2017-11-28 LAB — POCT GLYCOSYLATED HEMOGLOBIN (HGB A1C): Hemoglobin A1C: 14.2

## 2017-11-28 MED ORDER — LOSARTAN POTASSIUM-HCTZ 100-25 MG PO TABS: 1.0000 | ORAL_TABLET | Freq: Every day | ORAL | 2 refills | Status: AC

## 2017-11-28 MED ORDER — TRUEPLUS LANCETS 28G MISC: 28.0000 g | Freq: Four times a day (QID) | 3 refills | Status: AC

## 2017-11-28 MED ORDER — GLUCOSE BLOOD VI STRP: ORAL_STRIP | 12 refills | Status: AC

## 2017-11-28 MED ORDER — INSULIN GLARGINE 100 UNIT/ML SOLOSTAR PEN: 25.0000 [IU] | PEN_INJECTOR | Freq: Every day | SUBCUTANEOUS | 5 refills | Status: DC

## 2017-11-28 MED ORDER — POTASSIUM CHLORIDE ER 8 MEQ PO TBCR: 16.0000 meq | EXTENDED_RELEASE_TABLET | Freq: Every day | ORAL | 2 refills | Status: DC

## 2017-11-28 MED FILL — TRUEplus LANCETS 28G MISC: 25 days supply | Qty: 100 | Fill #0

## 2017-11-28 MED FILL — LOSARTAN-HCTZ 100-25 MG TAB: 100-25 | 30 days supply | Qty: 30 | Fill #0

## 2017-11-28 MED FILL — POTASSIUM CL ER 8 MEQ CAP: 8 | 30 days supply | Qty: 60 | Fill #0

## 2017-11-28 MED FILL — LANTUS SOLOSTAR 100 UNITS/M: 100 | 24 days supply | Qty: 6 | Fill #0

## 2017-11-28 NOTE — Patient Instructions (Signed)
Please give this patient an appointment with Franky Macho next week to be taught how to use her glucometer and for medication reconciliation.  Increase Lantus insulin to 25 units daily  Your blood pressure medication called Losartan-hydrochlorothiazide has been increased.  Start taking the potassium supplement as prescribed.

## 2017-11-28 NOTE — Progress Notes (Signed)
Patient ID: Wendy Allison, female    DOB: 1945-12-30  MRN: [id number]  CC: Establish Care; Diabetes; and Hypertension   Subjective: Wendy Allison is a 72 y.o. 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 Criss Rosales.  She decided to change because "she took me off all my medicines." Saw our PA in [place] Did not bring meds with her today.  States she is taking 4 oral pills but looking on her med list, it should be 6 oral meds a day.  K+ low on last visit with NP.  Potassium supplement recommended but pt states she never received the call about this.   DM: taking Lantus 20 units daily instead of 30 unit.  Only given 1 Lantus pen by pharmacy and she was afraid of running out prior to this appt.  Suppose to be on Amaryl but she is not sure if she has it at home.  Can not tolerate Metformin; caused diarrhea from past experience with it. Not checking BS, not sure how to use the machine No eye exam in 2 yrs.  No blurred vision, + tearing LT eye.  Told she had glaucoma 5-6 yrs ago when she lived in [place].  HTN: she reports taking Losartan/HCTZ.  She tries to limit salt intake. No CP/SOB.  + LE edema.  Hyperthyroidism:  She thinks she is taking the Tapazole.  I note some wgh loss in looking at her last 3 wghs recorded.  Denies palpitations  Patient Active Problem List   Diagnosis Date Noted  . Diabetes mellitus without complication ([place]) 17/79/3903     Current Outpatient Medications on File Prior to Visit  Medication Sig Dispense Refill  . aspirin EC 81 MG tablet Take 1 tablet (81 mg total) by mouth daily. (Patient not taking: Reported on 10/28/2017) 150 tablet 0  . atenolol (TENORMIN) 50 MG tablet Take 1 tablet (50 mg total) by mouth daily. 90 tablet 0  . Blood Glucose Monitoring  Suppl (TRUE METRIX METER) DEVI 1 kit by Does not apply route 4 (four) times daily. 1 Device 0  . glimepiride (AMARYL) 4 MG tablet Take 1 tablet (4 mg total) by mouth daily before breakfast. 30 tablet 3  . simvastatin (ZOCOR) 20 MG tablet Take 20 mg by mouth daily at 6 PM.     No current facility-administered medications on file prior to visit.     No Known Allergies  Social History   Socioeconomic History  . Marital status: Married    Spouse name: Not on file  . Number of children: Not on file  . Years of education: Not on file  . Highest education level: Not on file  Occupational History  . Not on file  Social Needs  . Financial resource strain: Not on file  . Food insecurity:    Worry: Not on file    Inability: Not on file  . Transportation needs:    Medical: Not on file    Non-medical: Not on file  Tobacco Use  . Smoking status: Never Smoker  . Smokeless tobacco: Never Used  Substance and Sexual Activity  . Alcohol use: No  . Drug use: No  . Sexual activity: Not on file  Lifestyle  . Physical activity:    Days per week:  Not on file    Minutes per session: Not on file  . Stress: Not on file  Relationships  . Social connections:    Talks on phone: Not on file    Gets together: Not on file    Attends religious service: Not on file    Active member of club or organization: Not on file    Attends meetings of clubs or organizations: Not on file    Relationship status: Not on file  . Intimate partner violence:    Fear of current or ex partner: Not on file    Emotionally abused: Not on file    Physically abused: Not on file    Forced sexual activity: Not on file  Other Topics Concern  . Not on file  Social History Narrative  . Not on file    No family history on file.  No past surgical history on file.  ROS: Review of Systems Neg except as above PHYSICAL EXAM: BP 138/76   Pulse 86   Temp 97.9 F (36.6 C) (Oral)   Resp 18   Ht 5' 8"  (1.727 m)   Wt 203  lb (92.1 kg)   SpO2 99%   BMI 30.87 kg/m   Wt Readings from Last 3 Encounters:  11/28/17 203 lb (92.1 kg)  10/28/17 200 lb 3.2 oz (90.8 kg)  10/03/17 210 lb (95.3 kg)    Physical Exam  General appearance - alert, well appearing, elderly AAF and in no distress Ears - mild ptosis of LT eye lid Mouth - few decayed teeth partially broken off in gum Neck - supple, no significant adenopathy Chest - clear to auscultation, no wheezes, rales or rhonchi, symmetric air entry Heart - normal rate, regular rhythm, normal S1, S2, no murmurs, rubs, clicks or gallops Extremities -  1+ LE edema  Lab Results  Component Value Date   WBC 7.7 08/24/2017   HGB 12.7 08/24/2017   HCT 38.7 08/24/2017   MCV 82.7 08/24/2017   PLT 202 08/24/2017     Chemistry      Component Value Date/Time   NA 142 10/28/2017 1430   K 3.3 (L) 10/28/2017 1430   CL 100 10/28/2017 1430   CO2 26 10/28/2017 1430   BUN 11 10/28/2017 1430   CREATININE 0.87 10/28/2017 1430      Component Value Date/Time   CALCIUM 10.2 10/28/2017 1430   ALKPHOS 245 (H) 10/28/2017 1430   AST 13 10/28/2017 1430   ALT 18 10/28/2017 1430   BILITOT 0.6 10/28/2017 1430      Results for orders placed or performed in visit on 11/28/17  TSH+T4F+T3Free  Result Value Ref Range   TSH <0.006 (L) 0.450 - 4.500 uIU/mL   T3, Free 7.2 (H) 2.0 - 4.4 pg/mL   Free T4 2.33 (H) 0.82 - 1.77 ng/dL  POCT glucose (manual entry)  Result Value Ref Range   POC Glucose 291 (A) 70 - 99 mg/dl  POCT glycosylated hemoglobin (Hb A1C)  Result Value Ref Range   Hemoglobin A1C 14.2     ASSESSMENT AND PLAN: 1. Diabetes mellitus type 2, uncontrolled, with complications ([place]) Will send new rxn to pharmacy to try get her enough Lantus pens to last per mth.  Inc Lantus to 25 units.  -f/u with clinical pharmacist next wk.  Advise to bring meter and testing supplies so that she can be taught to use it.  Also advise to bring all meds to that visit for a med  reconciliation -  POCT glucose (manual entry) - POCT glycosylated hemoglobin (Hb A1C) - Microalbumin / creatinine urine ratio - Insulin Glargine (LANTUS SOLOSTAR) 100 UNIT/ML Solostar Pen; Inject 25 Units into the skin daily at 10 pm.  Dispense: 15 mL; Refill: 5 - Ambulatory referral to Ophthalmology - TRUEPLUS LANCETS 28G MISC; 28 g by Does not apply route 4 (four) times daily.  Dispense: 120 each; Refill: 3 - glucose blood (TRUE METRIX BLOOD GLUCOSE TEST) test strip; Use as instructed  Dispense: 100 each; Refill: 12  2. Essential hypertension BP goal of 130/80.  Will increase HCTZ portion of the Losartan HCTZ to get her to goal and help dec LE edema.  Low salt diet advised -Also advise to pick up rxn for Potassium supplement today - losartan-hydrochlorothiazide (HYZAAR) 100-25 MG tablet; Take 1 tablet by mouth daily.  Dispense: 90 tablet; Refill: 2 - potassium chloride (KLOR-CON) 8 MEQ tablet; Take 2 tablets (16 mEq total) by mouth daily.  Dispense: 180 tablet; Refill: 2  3. Hyperthyroidism - TSH+T4F+T3Free  Patient was given the opportunity to ask questions.  Patient verbalized understanding of the plan and was able to repeat key elements of the plan.   Orders Placed This Encounter  Procedures  . Microalbumin / creatinine urine ratio  . TSH+T4F+T3Free  . Ambulatory referral to Ophthalmology  . POCT glucose (manual entry)  . POCT glycosylated hemoglobin (Hb A1C)     Requested Prescriptions   Signed Prescriptions Disp Refills  . Insulin Glargine (LANTUS SOLOSTAR) 100 UNIT/ML Solostar Pen 15 mL 5    Sig: Inject 25 Units into the skin daily at 10 pm.  . losartan-hydrochlorothiazide (HYZAAR) 100-25 MG tablet 90 tablet 2    Sig: Take 1 tablet by mouth daily.  . potassium chloride (KLOR-CON) 8 MEQ tablet 180 tablet 2    Sig: Take 2 tablets (16 mEq total) by mouth daily.  . TRUEPLUS LANCETS 28G MISC 120 each 3    Sig: 28 g by Does not apply route 4 (four) times daily.  Marland Kitchen glucose  blood (TRUE METRIX BLOOD GLUCOSE TEST) test strip 100 each 12    Sig: Use as instructed    Return in about 1 month (around 12/29/2017).  Karle Plumber, MD, FACP

## 2017-11-29 ENCOUNTER — Other Ambulatory Visit: Payer: Self-pay | Admitting: Internal Medicine

## 2017-11-29 ENCOUNTER — Telehealth: Payer: Self-pay

## 2017-11-29 DIAGNOSIS — N3941 Urge incontinence: Secondary | ICD-10-CM | POA: Insufficient documentation

## 2017-11-29 DIAGNOSIS — E0590 Thyrotoxicosis, unspecified without thyrotoxic crisis or storm: Secondary | ICD-10-CM | POA: Insufficient documentation

## 2017-11-29 DIAGNOSIS — I6930 Unspecified sequelae of cerebral infarction: Secondary | ICD-10-CM | POA: Insufficient documentation

## 2017-11-29 DIAGNOSIS — I10 Essential (primary) hypertension: Secondary | ICD-10-CM | POA: Insufficient documentation

## 2017-11-29 LAB — MICROALBUMIN / CREATININE URINE RATIO
Creatinine, Urine: 122.6 mg/dL
Microalb/Creat Ratio: 27.2 mg/g creat (ref 0.0–30.0)
Microalbumin, Urine: 33.4 ug/mL

## 2017-11-29 LAB — TSH+T4F+T3FREE
Free T4: 2.33 ng/dL — ABNORMAL HIGH (ref 0.82–1.77)
T3, Free: 7.2 pg/mL — ABNORMAL HIGH (ref 2.0–4.4)
TSH: <0.006 u[IU]/mL — ABNORMAL LOW (ref 0.450–4.500)

## 2017-11-29 MED ORDER — METHIMAZOLE 10 MG PO TABS: 20.0000 mg | ORAL_TABLET | Freq: Every day | ORAL | 2 refills | Status: AC

## 2017-11-29 MED FILL — methIMAzole 10 MG TABS: 10 | 30 days supply | Qty: 60 | Fill #0

## 2017-11-29 NOTE — Telephone Encounter (Signed)
Contacted pt to go over lab results pt is aware and doesn't have any questions or concerns 

## 2017-12-04 ENCOUNTER — Encounter: Payer: Self-pay | Admitting: Pharmacist

## 2017-12-04 ENCOUNTER — Ambulatory Visit: Payer: Medicare Other | Attending: Internal Medicine | Admitting: Pharmacist

## 2017-12-04 DIAGNOSIS — Z7189 Other specified counseling: Secondary | ICD-10-CM | POA: Diagnosis not present

## 2017-12-04 DIAGNOSIS — Z9114 Patient's other noncompliance with medication regimen: Secondary | ICD-10-CM | POA: Insufficient documentation

## 2017-12-04 MED FILL — ATENOLOL 50 MG TABLET: 50 | 30 days supply | Qty: 30 | Fill #1

## 2017-12-04 MED FILL — TRUE METRIX TEST STRIP: 100 days supply | Qty: 100 | Fill #0

## 2017-12-04 MED FILL — GLIMEPIRIDE 4 MG TABS: 4 | 30 days supply | Qty: 30 | Fill #1

## 2017-12-04 NOTE — Progress Notes (Signed)
    S: Patient arrives in good spirits. She is here at the request of Dr. Laural Benes for medication reconciliation and glucometer teaching. She was referred by Dr. Laural Benes on 11/28/17.   Patient is unable to identify which medications she should or should not be taking. There are multiple pill types in single prescription bottles. Additionally, she still has some Hyzaar 100-12.5 mg tablets.   Current Outpatient Medications on File Prior to Visit:  - atenolol 50 mg: Take 1 tablet by mouth daily.  - glimepiride : Take 1 tablet by mouth daily before breakfast.  - Lantus Solostar pen: Inject 25 units into the skin daily at 10 pm. - losartan 100-25 mg: Take 1 tablet by mouth daily.   - methimazole : Take 2 tablets by mouth daily.  - potassium chloride 8 mEq: Take 2 capsules by mouth daily.  - True Metrix glucose testing supplies   O:  No objective measurements taken during this encounter  A/P: Ms. Robicheaux presents with low health literacy and poor medication compliance. She was referred by Dr. Laural Benes for medication reconciliation and device troubleshooting.   Ms. Chipley has very little understanding of what medications she is supposed to be taking.  I spent a significant amount of time explaining the indication for each medicine she has on her profile. We were able to provide her with a pill box and I helped her fill this according to her directions for use for each medication. She knows to come see me or any pharmacist in the pharmacy to help refill her pillbox as needed. I disposed of her old Hyzaar tablets and identified multiple pills in single prescription bottles. I placed these back in the correct prescription bottles. Of note, she states she is not taking simvastatin. I told her to follow-up with Dr. Laural Benes to see if she should be taking this. I will route that question to her.   After review, patient is able to verbalize understanding of both indication and correct instructions for use.  Her pillbox should increase adherence and minimize errors for her.   I was able to get testing strips for her glucometer. She is able to repeat proper technique and knows to contact me with any questions.   Patient verbalized understanding of the plan. Written instructions provided. Total time counseling 25 minutes.  F/u with PCP on 12/30/17.  Butch Penny, PharmD  Clinical Pharmacist [facility]  [phone]

## 2017-12-04 NOTE — Patient Instructions (Signed)
Thank you for coming to see me today. Please continue to take all medications as prescribed. If you need help refilling your pill box, please bring this in to the pharmacy and the pharmacist on duty will be glad to help fill that. If you have any medication-related questions please contact us.

## 2017-12-05 ENCOUNTER — Other Ambulatory Visit: Payer: Self-pay | Admitting: Internal Medicine

## 2017-12-05 MED ORDER — ATORVASTATIN CALCIUM 10 MG PO TABS: 10.0000 mg | ORAL_TABLET | Freq: Every day | ORAL | 3 refills | Status: AC

## 2017-12-30 ENCOUNTER — Ambulatory Visit: Payer: Medicare Other | Admitting: Internal Medicine

## 2018-01-01 MED FILL — LANTUS SOLOSTAR 100 UNITS/M: 100 | 24 days supply | Qty: 6 | Fill #1

## 2018-01-02 ENCOUNTER — Other Ambulatory Visit: Payer: Self-pay

## 2018-01-02 MED ORDER — INSULIN PEN NEEDLE 32G X 4 MM MISC: 12 refills | Status: AC

## 2018-01-02 MED FILL — ATENOLOL 50 MG TABLET: 50 | 30 days supply | Qty: 30 | Fill #2

## 2018-01-02 MED FILL — ATORVASTATIN 10 MG TABLET: 10 | 30 days supply | Qty: 30 | Fill #0

## 2018-01-02 MED FILL — TRUEplus LANCETS 28G MISC: 25 days supply | Qty: 100 | Fill #1

## 2018-01-02 MED FILL — methIMAzole 10 MG TABS: 10 | 30 days supply | Qty: 60 | Fill #1

## 2018-01-02 MED FILL — TRUEPLUS PEN NDL 32GX5/32": 32G X 4 MM | 30 days supply | Qty: 100 | Fill #0

## 2018-01-02 MED FILL — TRUEPLUS PEN NDL 32GX5/32: 32G X 4 MM | 30 days supply | Qty: 100 | Fill #0

## 2018-01-02 MED FILL — LOSARTAN-HCTZ 100-25 MG TAB: 100-25 | 30 days supply | Qty: 30 | Fill #1

## 2018-01-16 ENCOUNTER — Ambulatory Visit (INDEPENDENT_AMBULATORY_CARE_PROVIDER_SITE_OTHER): Payer: Medicare Other | Admitting: Family Medicine

## 2018-01-16 ENCOUNTER — Encounter: Payer: Self-pay | Admitting: Family Medicine

## 2018-01-16 ENCOUNTER — Other Ambulatory Visit: Payer: Self-pay

## 2018-01-16 VITALS — BP 140/64 | HR 93 | Temp 97.7°F | Ht 68.0 in | Wt 200.2 lb

## 2018-01-16 DIAGNOSIS — Z8669 Personal history of other diseases of the nervous system and sense organs: Secondary | ICD-10-CM

## 2018-01-16 DIAGNOSIS — E0590 Thyrotoxicosis, unspecified without thyrotoxic crisis or storm: Secondary | ICD-10-CM | POA: Diagnosis not present

## 2018-01-16 DIAGNOSIS — H409 Unspecified glaucoma: Secondary | ICD-10-CM

## 2018-01-16 MED ORDER — AZELASTINE HCL 0.05 % OP SOLN: 1.0000 [drp] | Freq: Two times a day (BID) | OPHTHALMIC | 1 refills | Status: DC

## 2018-01-16 NOTE — Progress Notes (Signed)
Subjective:  By signing my name below, I, Wendy Allison, attest that this documentation has been prepared under the direction and in the presence of Wendy Agreste, MD Electronically Signed: Ladene Allison, ED Scribe 01/16/2018 at 10:35 AM.   Patient ID: Wendy Allison, female    DOB: 27-May-1946, 72 y.o.   MRN: [id number]  Chief Complaint  Patient presents with  . left eye redness    going couple of weeks    HPI Wendy Allison is a 72 y.o. female who presents to Primary Care at [facility] complaining of L eye redness. H/o DM, hypothyroidism, CVA. Uncontrolled DM with A1C 14 in Feb. Followed by [facility].  Pt reports L eye redness x sev wks, worse this morning. She reports intermittent bilateral eye itching (currently the R) and L eye watering last night. She does report rubbing/scratching the eye over the past few days but does not recall getting anything in her eyes. Last optho visit was sev yrs ago when she living in [place]. Diagnosed with glaucoma ~5-6 yrs ago on the L, given drops. Has been out of drops for a while. Also reports she has been off tapazole for ~1 wk. Denies blurred vision, nasal congestion, rhinorrhea. She only wears reading glasses.  Patient Active Problem List   Diagnosis Date Noted  . Essential hypertension 11/29/2017  . Hyperthyroidism 11/29/2017  . Urge incontinence 11/29/2017  . History of cerebrovascular accident (CVA) with residual deficit 11/29/2017  . Diabetes mellitus without complication ([place]) 30/16/0109   Past Medical History:  Diagnosis Date  . Diabetes mellitus without complication ([place])   . Hyperlipidemia   . Hypertension   . Stroke [facility])    History reviewed. No pertinent surgical history. No Known Allergies Prior to Admission medications   Medication Sig Start Date End Date Taking? Authorizing Provider  aspirin EC 81 MG tablet Take 1 tablet (81 mg total) by mouth daily. Patient not taking: Reported on 10/28/2017  09/06/17   Brayton Caves, PA-C  atenolol (TENORMIN) 50 MG tablet Take 1 tablet (50 mg total) by mouth daily. 10/28/17   Gildardo Pounds, NP  atorvastatin (LIPITOR) 10 MG tablet Take 1 tablet (10 mg total) by mouth daily. 12/05/17   Ladell Pier, MD  Blood Glucose Monitoring Suppl (TRUE METRIX METER) DEVI 1 kit by Does not apply route 4 (four) times daily. 09/06/17   Brayton Caves, PA-C  glimepiride (AMARYL) 4 MG tablet Take 1 tablet (4 mg total) by mouth daily before breakfast. 10/28/17   Gildardo Pounds, NP  glucose blood (TRUE METRIX BLOOD GLUCOSE TEST) test strip Use as instructed 11/28/17   Ladell Pier, MD  Insulin Glargine (LANTUS SOLOSTAR) 100 UNIT/ML Solostar Pen Inject 25 Units into the skin daily at 10 pm. 11/28/17   Ladell Pier, MD  Insulin Pen Needle (ULTICARE MICRO PEN NEEDLES) 32G X 4 MM MISC Use with insulin 01/02/18   Ladell Pier, MD  losartan-hydrochlorothiazide (HYZAAR) 100-25 MG tablet Take 1 tablet by mouth daily. 11/28/17   Ladell Pier, MD  methimazole (TAPAZOLE) 10 MG tablet Take 2 tablets (20 mg total) by mouth daily. 11/29/17   Ladell Pier, MD  potassium chloride (KLOR-CON) 8 MEQ tablet Take 2 tablets (16 mEq total) by mouth daily. 11/28/17   Ladell Pier, MD  TRUEPLUS LANCETS 28G MISC 28 g by Does not apply route 4 (four) times daily. 11/28/17   Ladell Pier, MD  Social History   Socioeconomic History  . Marital status: Married    Spouse name: Not on file  . Number of children: Not on file  . Years of education: Not on file  . Highest education level: Not on file  Occupational History  . Not on file  Social Needs  . Financial resource strain: Not on file  . Food insecurity:    Worry: Not on file    Inability: Not on file  . Transportation needs:    Medical: Not on file    Non-medical: Not on file  Tobacco Use  . Smoking status: Never Smoker  . Smokeless tobacco: Never Used  Substance and Sexual Activity  . Alcohol  use: No  . Drug use: No  . Sexual activity: Not on file  Lifestyle  . Physical activity:    Days per week: Not on file    Minutes per session: Not on file  . Stress: Not on file  Relationships  . Social connections:    Talks on phone: Not on file    Gets together: Not on file    Attends religious service: Not on file    Active member of club or organization: Not on file    Attends meetings of clubs or organizations: Not on file    Relationship status: Not on file  . Intimate partner violence:    Fear of current or ex partner: Not on file    Emotionally abused: Not on file    Physically abused: Not on file    Forced sexual activity: Not on file  Other Topics Concern  . Not on file  Social History Narrative  . Not on file   Review of Systems  HENT: Negative for congestion and rhinorrhea.   Eyes: Positive for discharge (watering), redness and itching. Negative for visual disturbance.      Objective:   Physical Exam  Constitutional: She is oriented to person, place, and time. She appears well-developed and well-nourished. No distress.  HENT:  Head: Normocephalic and atraumatic.  Eyes: Pupils are equal, round, and reactive to light. EOM are normal. Right conjunctiva is injected (very minimal). Right eye exhibits no nystagmus. Left eye exhibits no nystagmus.  L lid: does not open as far as R but able to independently open/close lid. No difficulty with closure. Slight proptosis of R vs L. No exudate of canthi or surrounding erythema. No pain with closed eye palpation.  Neck: Neck supple. No tracheal deviation present.  Cardiovascular: Normal rate.  Pulmonary/Chest: Effort normal. No respiratory distress.  Musculoskeletal: Normal range of motion.  Lymphadenopathy:       Head (left side): No preauricular adenopathy present.  Neurological: She is alert and oriented to person, place, and time.  Skin: Skin is warm and dry.  Psychiatric: She has a normal mood and affect. Her behavior  is normal.  Nursing note and vitals reviewed.   Vitals:   01/16/18 0956  BP: 140/64  Pulse: 93  Temp: 97.7 F (36.5 C)  TempSrc: Oral  SpO2: 97%  Weight: 200 lb 3.2 oz (90.8 kg)  Height: 5' 8"  (1.727 m)    Visual Acuity Screening   Right eye Left eye Both eyes  Without correction: 20/70-1 20/70 20/50  With correction:         Assessment & Plan:   Wendy Allison is a 72 y.o. female Glaucoma, unspecified glaucoma type, unspecified laterality - Plan: Ambulatory referral to Ophthalmology  History of itching of eye - Plan:  Ambulatory referral to Ophthalmology, azelastine (OPTIVAR) 0.05 % ophthalmic solution  Hyperthyroidism  Minimal injection on exam.  Concerned that her eye redness/irritation could be multifactorial including untreated glaucoma.  May have some component of allergic conjunctivitis.  -Stressed importance of acute follow-up with ophthalmology, and referral placed.  -Trial of Optivar drops for possible allergic component.  -Restart medication for her hyperthyroidism, which may also affect her eyes including proptosis.  Advised to contact her primary care provider if medications are cost prohibitive to look into other options.  Understanding expressed.  Meds ordered this encounter  Medications  . azelastine (OPTIVAR) 0.05 % ophthalmic solution    Sig: Place 1 drop into both eyes 2 (two) times daily. As needed for itching/allergies.    Dispense:  6 mL    Refill:  1   Patient Instructions   I do not see any signs of eye infection at this time.  Itching in the eyes may be due to allergies, but I am concerned that the redness or discomfort may be related to untreated glaucoma.  I did refer you to an eye specialist for further evaluation, please keep that appointment.  Additionally hyperthyroidism can also change the appearance of the eyes so please restart your medication.  If you are having difficulty affording the medication, please discuss that with your primary care  provider to see if there are other options.  Return to the clinic or go to the nearest emergency room if any of your symptoms worsen or new symptoms occur.     IF you received an x-ray today, you will receive an invoice from [facility]. Please contact [facility] at [phone] with questions or concerns regarding your invoice.   IF you received labwork today, you will receive an invoice from [facility]. Please contact [facility] at [phone] with questions or concerns regarding your invoice.   Our billing staff will not be able to assist you with questions regarding bills from these companies.  You will be contacted with the lab results as soon as they are available. The fastest way to get your results is to activate your My Chart account. Instructions are located on the last page of this paperwork. If you have not heard from [place] regarding the results in 2 weeks, please contact this office.       I personally performed the services described in this documentation, which was scribed in my presence. The recorded information has been reviewed and considered for accuracy and completeness, addended by me as needed, and agree with information above.  Signed,   Merri Ray, MD Primary Care at [place].  01/17/18 1:38 PM

## 2018-01-16 NOTE — Patient Instructions (Addendum)
I do not see any signs of eye infection at this time.  Itching in the eyes may be due to allergies, but I am concerned that the redness or discomfort may be related to untreated glaucoma.  I did refer you to an eye specialist for further evaluation, please keep that appointment.  Additionally hyperthyroidism can also change the appearance of the eyes so please restart your medication.  If you are having difficulty affording the medication, please discuss that with your primary care provider to see if there are other options.  Return to the clinic or go to the nearest emergency room if any of your symptoms worsen or new symptoms occur.     IF you received an x-ray today, you will receive an invoice from [facility]. Please contact [facility] at [phone] with questions or concerns regarding your invoice.   IF you received labwork today, you will receive an invoice from [facility]. Please contact [facility] at [phone] with questions or concerns regarding your invoice.   Our billing staff will not be able to assist you with questions regarding bills from these companies.  You will be contacted with the lab results as soon as they are available. The fastest way to get your results is to activate your My Chart account. Instructions are located on the last page of this paperwork. If you have not heard from us regarding the results in 2 weeks, please contact this office.

## 2018-01-27 ENCOUNTER — Ambulatory Visit: Payer: Medicare Other | Admitting: Internal Medicine

## 2018-02-04 MED FILL — LANTUS SOLOSTAR 100 UNITS/M: 100 | 24 days supply | Qty: 6 | Fill #2

## 2018-02-04 MED FILL — POTASSIUM CL ER 8 MEQ CAP: 8 | 30 days supply | Qty: 60 | Fill #1

## 2018-02-06 ENCOUNTER — Other Ambulatory Visit: Payer: Self-pay | Admitting: Family Medicine

## 2018-02-06 DIAGNOSIS — Z8669 Personal history of other diseases of the nervous system and sense organs: Secondary | ICD-10-CM

## 2018-02-07 NOTE — Telephone Encounter (Signed)
Azelastine  refill Last Refill:01/16/18 # 6 ml 1 RF Last OV: 01/16/18 PCP: Dr Neva SeatGreene Pharmacy:[facility] [address]

## 2018-03-01 ENCOUNTER — Ambulatory Visit (HOSPITAL_COMMUNITY)
Admission: EM | Admit: 2018-03-01 | Discharge: 2018-03-01 | Disposition: A | Discharge status: Home / Self Care | Payer: Medicare Other | Attending: Internal Medicine | Admitting: Internal Medicine

## 2018-03-01 ENCOUNTER — Other Ambulatory Visit: Payer: Self-pay

## 2018-03-01 ENCOUNTER — Encounter (HOSPITAL_COMMUNITY): Payer: Self-pay | Admitting: Emergency Medicine

## 2018-03-01 DIAGNOSIS — E118 Type 2 diabetes mellitus with unspecified complications: Secondary | ICD-10-CM

## 2018-03-01 DIAGNOSIS — E1165 Type 2 diabetes mellitus with hyperglycemia: Secondary | ICD-10-CM

## 2018-03-01 DIAGNOSIS — IMO0002 Reserved for concepts with insufficient information to code with codable children: Secondary | ICD-10-CM

## 2018-03-01 DIAGNOSIS — Z794 Long term (current) use of insulin: Secondary | ICD-10-CM | POA: Diagnosis not present

## 2018-03-01 DIAGNOSIS — I10 Essential (primary) hypertension: Secondary | ICD-10-CM

## 2018-03-01 DIAGNOSIS — E119 Type 2 diabetes mellitus without complications: Secondary | ICD-10-CM

## 2018-03-01 DIAGNOSIS — Z760 Encounter for issue of repeat prescription: Secondary | ICD-10-CM | POA: Diagnosis not present

## 2018-03-01 LAB — POCT I-STAT, CHEM 8
BUN: 12 mg/dL (ref 8–23)
CHLORIDE: 104 mmol/L (ref 98–111)
CREATININE: 0.9 mg/dL (ref 0.44–1.00)
Calcium, Ion: 1.36 mmol/L (ref 1.15–1.40)
Glucose, Bld: 346 mg/dL — ABNORMAL HIGH (ref 70–99)
HEMATOCRIT: 39 % (ref 36.0–46.0)
HEMOGLOBIN: 13.3 g/dL (ref 12.0–15.0)
POTASSIUM: 3.2 mmol/L — AB (ref 3.5–5.1)
Sodium: 141 mmol/L (ref 135–145)
TCO2: 23 mmol/L (ref 22–32)

## 2018-03-01 LAB — POCT URINALYSIS DIP (DEVICE)
BILIRUBIN URINE: NEGATIVE
GLUCOSE, UA: 500 mg/dL — AB
KETONES UR: NEGATIVE mg/dL
LEUKOCYTES UA: NEGATIVE
Nitrite: POSITIVE — AB
Protein, ur: NEGATIVE mg/dL
Specific Gravity, Urine: <=1.005 (ref 1.005–1.030)
Urobilinogen, UA: 0.2 mg/dL (ref 0.0–1.0)
pH: 5.5 (ref 5.0–8.0)

## 2018-03-01 MED ORDER — INSULIN GLARGINE 100 UNIT/ML SOLOSTAR PEN: 25.0000 [IU] | PEN_INJECTOR | Freq: Every day | SUBCUTANEOUS | 5 refills | Status: AC

## 2018-03-01 MED ORDER — POTASSIUM CHLORIDE ER 8 MEQ PO TBCR: 16.0000 meq | EXTENDED_RELEASE_TABLET | Freq: Every day | ORAL | 0 refills | Status: AC

## 2018-03-01 NOTE — ED Provider Notes (Signed)
[place]    CSN: [id number] Arrival date & time: 03/01/18  1424     History   Chief Complaint Chief Complaint  Patient presents with  . Medication Refill    HPI Wendy Allison is a 72 y.o. female history of hypertension, hyperlipidemia, DM type II presenting today for medication refill of her insulin.  Patient states that she has been out of her Lantus for the past couple of weeks.  She has not been back to her PCP, she is wanting to switch PCPs.  She does not check her blood sugar at home as her machine is broken.  She denies any nausea or vomiting.  He has had some decreased appetite and some mild abdominal discomfort.   HPI  Past Medical History:  Diagnosis Date  . Diabetes mellitus without complication ([place])   . Hyperlipidemia   . Hypertension   . Stroke [facility])     Patient Active Problem List   Diagnosis Date Noted  . Essential hypertension 11/29/2017  . Hyperthyroidism 11/29/2017  . Urge incontinence 11/29/2017  . History of cerebrovascular accident (CVA) with residual deficit 11/29/2017  . Diabetes mellitus without complication ([place]) 03/47/4259    History reviewed. No pertinent surgical history.  OB History   None      Home Medications    Prior to Admission medications   Medication Sig Start Date End Date Taking? Authorizing Provider  Cranberry-Vitamin C-Probiotic (AZO CRANBERRY PO) Take by mouth.   Yes [provider]  atenolol (TENORMIN) 50 MG tablet Take 1 tablet (50 mg total) by mouth daily. 10/28/17   Gildardo Pounds, NP  atorvastatin (LIPITOR) 10 MG tablet Take 1 tablet (10 mg total) by mouth daily. 12/05/17   Ladell Pier, MD  azelastine (OPTIVAR) 0.05 % ophthalmic solution PLACE 1 DROP INTO BOTH EYES 2 (TWO) TIMES DAILY. AS NEEDED FOR ITCHING/ALLERGIES. 02/07/18   [name], MD  Blood Glucose Monitoring Suppl (TRUE METRIX METER) DEVI 1 kit by Does not apply route 4 (four) times daily. 09/06/17   Brayton Caves, PA-C    glimepiride (AMARYL) 4 MG tablet Take 1 tablet (4 mg total) by mouth daily before breakfast. 10/28/17   Gildardo Pounds, NP  glucose blood (TRUE METRIX BLOOD GLUCOSE TEST) test strip Use as instructed 11/28/17   Ladell Pier, MD  Insulin Glargine (LANTUS SOLOSTAR) 100 UNIT/ML Solostar Pen Inject 25 Units into the skin daily at 10 pm. 03/01/18   Laasia Arcos C, PA-C  Insulin Pen Needle (ULTICARE MICRO PEN NEEDLES) 32G X 4 MM MISC Use with insulin 01/02/18   Ladell Pier, MD  losartan-hydrochlorothiazide (HYZAAR) 100-25 MG tablet Take 1 tablet by mouth daily. 11/28/17   Ladell Pier, MD  methimazole (TAPAZOLE) 10 MG tablet Take 2 tablets (20 mg total) by mouth daily. 11/29/17   Ladell Pier, MD  potassium chloride (KLOR-CON) 8 MEQ tablet Take 2 tablets (16 mEq total) by mouth daily for 5 days. 03/01/18 03/06/18  Breyden Jeudy C, PA-C  TRUEPLUS LANCETS 28G MISC 28 g by Does not apply route 4 (four) times daily. 11/28/17   Ladell Pier, MD    Family History History reviewed. No pertinent family history.  Social History Social History   Tobacco Use  . Smoking status: Never Smoker  . Smokeless tobacco: Never Used  Substance Use Topics  . Alcohol use: No  . Drug use: No     Allergies   Patient has no known allergies.  Review of Systems Review of Systems  Constitutional: Positive for appetite change. Negative for fatigue and fever.  HENT: Negative for congestion, sinus pressure and sore throat.   Eyes: Negative for photophobia, pain and visual disturbance.  Respiratory: Negative for cough and shortness of breath.   Cardiovascular: Negative for chest pain.  Gastrointestinal: Positive for abdominal pain. Negative for nausea and vomiting.  Genitourinary: Negative for decreased urine volume, dysuria and hematuria.  Musculoskeletal: Negative for myalgias, neck pain and neck stiffness.  Neurological: Negative for dizziness, syncope, facial asymmetry, speech  difficulty, weakness, light-headedness, numbness and headaches.     Physical Exam Triage Vital Signs ED Triage Vitals  Enc Vitals Group     BP 03/01/18 1523 (!) 181/81     Pulse Rate 03/01/18 1523 94     Resp 03/01/18 1523 18     Temp 03/01/18 1523 97.9 F (36.6 C)     Temp Source 03/01/18 1523 Oral     SpO2 03/01/18 1523 100 %     Weight --      Height --      Head Circumference --      Peak Flow --      Pain Score 03/01/18 1529 8     Pain Loc --      Pain Edu? --      Excl. in [place]? --    No data found.  Updated Vital Signs BP (!) 181/81 (BP Location: Right Arm) Comment: Pt would not sit still when taking BP  Pulse 94   Temp 97.9 F (36.6 C) (Oral)   Resp 18   SpO2 100%   Visual Acuity Right Eye Distance:   Left Eye Distance:   Bilateral Distance:    Right Eye Near:   Left Eye Near:    Bilateral Near:     Physical Exam  Constitutional: She is oriented to person, place, and time. She appears well-developed and well-nourished. No distress.  No acute distress  HENT:  Head: Normocephalic and atraumatic.  Mouth/Throat: Oropharynx is clear and moist.  Eyes: Conjunctivae are normal.  Neck: Neck supple.  Cardiovascular: Normal rate and regular rhythm.  No murmur heard. Pulmonary/Chest: Effort normal and breath sounds normal. No respiratory distress.  Breathing comfortably at rest, CTABL, no wheezing, rales or other adventitious sounds auscultated  Abdominal: Soft. There is no tenderness.  Nontender to light deep palpation throughout all 4 quadrants and epigastrium of abdomen  Musculoskeletal: She exhibits no edema.  Able to ambulate from chair to exam table without assistance  Neurological: She is alert and oriented to person, place, and time. No cranial nerve deficit.  Skin: Skin is warm and dry.  Psychiatric: She has a normal mood and affect.  Nursing note and vitals reviewed.    UC Treatments / Results  Labs (all labs ordered are listed, but only  abnormal results are displayed) Labs Reviewed  POCT URINALYSIS DIP (DEVICE) - Abnormal; Notable for the following components:      Result Value   Glucose, UA 500 (*)    Hgb urine dipstick TRACE (*)    Nitrite POSITIVE (*)    All other components within normal limits  POCT I-STAT, CHEM 8 - Abnormal; Notable for the following components:   Potassium 3.2 (*)    Glucose, Bld 346 (*)    All other components within normal limits    EKG None  Radiology No results found.  Procedures Procedures (including critical care time)  Medications Ordered in UC Medications -  No data to display  Initial Impression / Assessment and Plan / UC Course  I have reviewed the triage vital signs and the nursing notes.  Pertinent labs & imaging results that were available during my care of the patient were reviewed by me and considered in my medical decision making (see chart for details).     No ketones in urine, blood sugar 346.  Will refill patient's insulin, her potassium was also low at 3.2, she has previously been on potassium chloride, repeat refill this, as well as discussed implementing potassium in her diet.  Establish care with PCP.  Follow-up here if symptoms worsening.Discussed strict return precautions. Patient verbalized understanding and is agreeable with plan.  Final Clinical Impressions(s) / UC Diagnoses   Final diagnoses:  Type 2 diabetes mellitus without complication, with long-term current use of insulin ([place])  Medication refill     Discharge Instructions     I have refilled your Lantus Blood sugar today was 326 Your potassium was slightly low-please try to incorporate more potassium containing foods in your diet, please take potassium chloride pills daily for the next 5 days  Please return if feeling increasingly fatigued, developing nausea, vomiting, worsening abdominal pain   ED Prescriptions    Medication Sig Dispense Auth. Provider   potassium chloride (KLOR-CON) 8  MEQ tablet Take 2 tablets (16 mEq total) by mouth daily for 5 days. 10 tablet Amorina Doerr C, PA-C   Insulin Glargine (LANTUS SOLOSTAR) 100 UNIT/ML Solostar Pen Inject 25 Units into the skin daily at 10 pm. 15 mL Anniece Bleiler C, PA-C     Controlled Substance Prescriptions [place] Controlled Substance Registry consulted? Not Applicable   Janith Lima, [place] 03/01/18 1723

## 2018-03-01 NOTE — Discharge Instructions (Signed)
I have refilled your Lantus Blood sugar today was 326 Your potassium was slightly low-please try to incorporate more potassium containing foods in your diet, please take potassium chloride pills daily for the next 5 days  Please return if feeling increasingly fatigued, developing nausea, vomiting, worsening abdominal pain

## 2018-03-01 NOTE — ED Triage Notes (Signed)
Patient says she ran out of insulin 2 days ago.

## 2018-03-04 MED FILL — methIMAzole 10 MG TABS: 10 | 30 days supply | Qty: 60 | Fill #2

## 2018-03-04 MED FILL — TRUEPLUS PEN NDL 32GX5/32": 32G X 4 MM | 30 days supply | Qty: 100 | Fill #1

## 2018-03-04 MED FILL — LANTUS SOLOSTAR 100 UNITS/M: 100 | 24 days supply | Qty: 6 | Fill #3

## 2018-03-04 MED FILL — TRUEPLUS PEN NDL 32GX5/32: 32G X 4 MM | 30 days supply | Qty: 100 | Fill #1

## 2018-03-13 ENCOUNTER — Ambulatory Visit: Payer: Medicare Other | Admitting: Internal Medicine

## 2018-03-21 DIAGNOSIS — R05 Cough: Secondary | ICD-10-CM | POA: Diagnosis not present

## 2018-03-21 DIAGNOSIS — M1990 Unspecified osteoarthritis, unspecified site: Secondary | ICD-10-CM | POA: Diagnosis not present

## 2018-03-21 DIAGNOSIS — F329 Major depressive disorder, single episode, unspecified: Secondary | ICD-10-CM | POA: Diagnosis not present

## 2018-03-21 DIAGNOSIS — R350 Frequency of micturition: Secondary | ICD-10-CM | POA: Diagnosis not present

## 2018-03-21 DIAGNOSIS — R300 Dysuria: Secondary | ICD-10-CM | POA: Diagnosis not present

## 2018-03-21 DIAGNOSIS — Z9114 Patient's other noncompliance with medication regimen: Secondary | ICD-10-CM | POA: Diagnosis not present

## 2018-03-21 DIAGNOSIS — R918 Other nonspecific abnormal finding of lung field: Secondary | ICD-10-CM | POA: Diagnosis not present

## 2018-03-21 DIAGNOSIS — E0520 Thyrotoxicosis with toxic multinodular goiter without thyrotoxic crisis or storm: Secondary | ICD-10-CM | POA: Diagnosis not present

## 2018-03-21 DIAGNOSIS — I2510 Atherosclerotic heart disease of native coronary artery without angina pectoris: Secondary | ICD-10-CM | POA: Diagnosis not present

## 2018-03-21 DIAGNOSIS — E7800 Pure hypercholesterolemia, unspecified: Secondary | ICD-10-CM | POA: Diagnosis not present

## 2018-03-21 DIAGNOSIS — R000 Tachycardia, unspecified: Secondary | ICD-10-CM | POA: Diagnosis not present

## 2018-03-21 DIAGNOSIS — R0789 Other chest pain: Secondary | ICD-10-CM | POA: Diagnosis not present

## 2018-03-21 DIAGNOSIS — E1165 Type 2 diabetes mellitus with hyperglycemia: Secondary | ICD-10-CM | POA: Diagnosis not present

## 2018-03-21 DIAGNOSIS — I517 Cardiomegaly: Secondary | ICD-10-CM | POA: Diagnosis not present

## 2018-03-21 DIAGNOSIS — I252 Old myocardial infarction: Secondary | ICD-10-CM | POA: Diagnosis not present

## 2018-03-21 DIAGNOSIS — Z794 Long term (current) use of insulin: Secondary | ICD-10-CM | POA: Diagnosis not present

## 2018-03-21 DIAGNOSIS — R109 Unspecified abdominal pain: Secondary | ICD-10-CM | POA: Diagnosis not present

## 2018-03-21 DIAGNOSIS — I10 Essential (primary) hypertension: Secondary | ICD-10-CM | POA: Diagnosis not present

## 2018-03-22 DIAGNOSIS — R9431 Abnormal electrocardiogram [ECG] [EKG]: Secondary | ICD-10-CM | POA: Diagnosis not present

## 2018-03-22 DIAGNOSIS — Z9114 Patient's other noncompliance with medication regimen: Secondary | ICD-10-CM | POA: Diagnosis not present

## 2018-03-22 DIAGNOSIS — R300 Dysuria: Secondary | ICD-10-CM | POA: Diagnosis not present

## 2018-03-22 DIAGNOSIS — I517 Cardiomegaly: Secondary | ICD-10-CM | POA: Diagnosis not present

## 2018-03-22 DIAGNOSIS — E1165 Type 2 diabetes mellitus with hyperglycemia: Secondary | ICD-10-CM | POA: Diagnosis not present

## 2018-03-23 DIAGNOSIS — I110 Hypertensive heart disease with heart failure: Secondary | ICD-10-CM | POA: Diagnosis not present

## 2018-03-23 DIAGNOSIS — I509 Heart failure, unspecified: Secondary | ICD-10-CM | POA: Diagnosis not present

## 2018-03-23 DIAGNOSIS — I10 Essential (primary) hypertension: Secondary | ICD-10-CM | POA: Diagnosis not present

## 2018-03-23 DIAGNOSIS — Z9119 Patient's noncompliance with other medical treatment and regimen: Secondary | ICD-10-CM | POA: Diagnosis not present

## 2018-03-23 DIAGNOSIS — E1165 Type 2 diabetes mellitus with hyperglycemia: Secondary | ICD-10-CM | POA: Diagnosis not present

## 2018-03-23 DIAGNOSIS — Z794 Long term (current) use of insulin: Secondary | ICD-10-CM | POA: Diagnosis not present

## 2018-03-25 DIAGNOSIS — E1165 Type 2 diabetes mellitus with hyperglycemia: Secondary | ICD-10-CM | POA: Diagnosis not present

## 2018-03-25 DIAGNOSIS — I10 Essential (primary) hypertension: Secondary | ICD-10-CM | POA: Diagnosis not present

## 2018-03-25 DIAGNOSIS — B353 Tinea pedis: Secondary | ICD-10-CM | POA: Diagnosis not present

## 2018-03-25 DIAGNOSIS — E119 Type 2 diabetes mellitus without complications: Secondary | ICD-10-CM | POA: Diagnosis not present

## 2018-03-29 DIAGNOSIS — I252 Old myocardial infarction: Secondary | ICD-10-CM | POA: Diagnosis not present

## 2018-03-29 DIAGNOSIS — I110 Hypertensive heart disease with heart failure: Secondary | ICD-10-CM | POA: Diagnosis not present

## 2018-03-29 DIAGNOSIS — K921 Melena: Secondary | ICD-10-CM | POA: Diagnosis not present

## 2018-03-29 DIAGNOSIS — E0590 Thyrotoxicosis, unspecified without thyrotoxic crisis or storm: Secondary | ICD-10-CM | POA: Diagnosis not present

## 2018-03-29 DIAGNOSIS — E1165 Type 2 diabetes mellitus with hyperglycemia: Secondary | ICD-10-CM | POA: Diagnosis not present

## 2018-03-29 DIAGNOSIS — I509 Heart failure, unspecified: Secondary | ICD-10-CM | POA: Diagnosis not present

## 2018-03-29 DIAGNOSIS — R42 Dizziness and giddiness: Secondary | ICD-10-CM | POA: Diagnosis not present

## 2018-03-29 DIAGNOSIS — R55 Syncope and collapse: Secondary | ICD-10-CM | POA: Diagnosis not present

## 2018-03-29 DIAGNOSIS — E785 Hyperlipidemia, unspecified: Secondary | ICD-10-CM | POA: Diagnosis not present

## 2018-03-29 DIAGNOSIS — I2510 Atherosclerotic heart disease of native coronary artery without angina pectoris: Secondary | ICD-10-CM | POA: Diagnosis not present

## 2018-03-29 DIAGNOSIS — Z794 Long term (current) use of insulin: Secondary | ICD-10-CM | POA: Diagnosis not present

## 2018-03-29 DIAGNOSIS — Z79899 Other long term (current) drug therapy: Secondary | ICD-10-CM | POA: Diagnosis not present

## 2018-03-29 DIAGNOSIS — J189 Pneumonia, unspecified organism: Secondary | ICD-10-CM | POA: Diagnosis not present

## 2018-03-29 DIAGNOSIS — R300 Dysuria: Secondary | ICD-10-CM | POA: Diagnosis not present

## 2018-03-29 DIAGNOSIS — N179 Acute kidney failure, unspecified: Secondary | ICD-10-CM | POA: Diagnosis not present

## 2018-03-29 DIAGNOSIS — G510 Bell's palsy: Secondary | ICD-10-CM | POA: Diagnosis not present

## 2018-03-30 DIAGNOSIS — R739 Hyperglycemia, unspecified: Secondary | ICD-10-CM | POA: Diagnosis not present

## 2018-03-30 DIAGNOSIS — I110 Hypertensive heart disease with heart failure: Secondary | ICD-10-CM | POA: Diagnosis not present

## 2018-03-30 DIAGNOSIS — I10 Essential (primary) hypertension: Secondary | ICD-10-CM | POA: Diagnosis not present

## 2018-03-30 DIAGNOSIS — I509 Heart failure, unspecified: Secondary | ICD-10-CM | POA: Diagnosis not present

## 2018-03-30 DIAGNOSIS — E1165 Type 2 diabetes mellitus with hyperglycemia: Secondary | ICD-10-CM | POA: Diagnosis not present

## 2018-03-30 DIAGNOSIS — R55 Syncope and collapse: Secondary | ICD-10-CM | POA: Diagnosis not present

## 2018-03-31 DIAGNOSIS — E1165 Type 2 diabetes mellitus with hyperglycemia: Secondary | ICD-10-CM | POA: Diagnosis not present

## 2018-04-01 DIAGNOSIS — Z794 Long term (current) use of insulin: Secondary | ICD-10-CM | POA: Diagnosis not present

## 2018-04-01 DIAGNOSIS — I509 Heart failure, unspecified: Secondary | ICD-10-CM | POA: Diagnosis not present

## 2018-04-01 DIAGNOSIS — I2510 Atherosclerotic heart disease of native coronary artery without angina pectoris: Secondary | ICD-10-CM | POA: Diagnosis not present

## 2018-04-01 DIAGNOSIS — I110 Hypertensive heart disease with heart failure: Secondary | ICD-10-CM | POA: Diagnosis not present

## 2018-04-01 DIAGNOSIS — E1165 Type 2 diabetes mellitus with hyperglycemia: Secondary | ICD-10-CM | POA: Diagnosis not present

## 2018-04-01 DIAGNOSIS — Z8673 Personal history of transient ischemic attack (TIA), and cerebral infarction without residual deficits: Secondary | ICD-10-CM | POA: Diagnosis not present

## 2018-04-01 DIAGNOSIS — G510 Bell's palsy: Secondary | ICD-10-CM | POA: Diagnosis not present

## 2018-04-01 DIAGNOSIS — I252 Old myocardial infarction: Secondary | ICD-10-CM | POA: Diagnosis not present

## 2018-04-03 DIAGNOSIS — I252 Old myocardial infarction: Secondary | ICD-10-CM | POA: Diagnosis not present

## 2018-04-03 DIAGNOSIS — I2510 Atherosclerotic heart disease of native coronary artery without angina pectoris: Secondary | ICD-10-CM | POA: Diagnosis not present

## 2018-04-03 DIAGNOSIS — I110 Hypertensive heart disease with heart failure: Secondary | ICD-10-CM | POA: Diagnosis not present

## 2018-04-03 DIAGNOSIS — I509 Heart failure, unspecified: Secondary | ICD-10-CM | POA: Diagnosis not present

## 2018-04-03 DIAGNOSIS — E1165 Type 2 diabetes mellitus with hyperglycemia: Secondary | ICD-10-CM | POA: Diagnosis not present

## 2018-04-03 DIAGNOSIS — G510 Bell's palsy: Secondary | ICD-10-CM | POA: Diagnosis not present

## 2018-04-07 DIAGNOSIS — I110 Hypertensive heart disease with heart failure: Secondary | ICD-10-CM | POA: Diagnosis not present

## 2018-04-07 DIAGNOSIS — E1165 Type 2 diabetes mellitus with hyperglycemia: Secondary | ICD-10-CM | POA: Diagnosis not present

## 2018-04-07 DIAGNOSIS — I252 Old myocardial infarction: Secondary | ICD-10-CM | POA: Diagnosis not present

## 2018-04-07 DIAGNOSIS — G510 Bell's palsy: Secondary | ICD-10-CM | POA: Diagnosis not present

## 2018-04-07 DIAGNOSIS — I509 Heart failure, unspecified: Secondary | ICD-10-CM | POA: Diagnosis not present

## 2018-04-07 DIAGNOSIS — I2510 Atherosclerotic heart disease of native coronary artery without angina pectoris: Secondary | ICD-10-CM | POA: Diagnosis not present

## 2018-04-15 DIAGNOSIS — E1149 Type 2 diabetes mellitus with other diabetic neurological complication: Secondary | ICD-10-CM | POA: Diagnosis not present

## 2018-04-15 DIAGNOSIS — Z23 Encounter for immunization: Secondary | ICD-10-CM | POA: Diagnosis not present

## 2018-04-15 DIAGNOSIS — E875 Hyperkalemia: Secondary | ICD-10-CM | POA: Diagnosis not present

## 2018-04-15 DIAGNOSIS — B351 Tinea unguium: Secondary | ICD-10-CM | POA: Diagnosis not present

## 2018-04-15 DIAGNOSIS — E1165 Type 2 diabetes mellitus with hyperglycemia: Secondary | ICD-10-CM | POA: Diagnosis not present

## 2018-04-23 DIAGNOSIS — E1165 Type 2 diabetes mellitus with hyperglycemia: Secondary | ICD-10-CM | POA: Diagnosis not present

## 2018-04-23 DIAGNOSIS — I252 Old myocardial infarction: Secondary | ICD-10-CM | POA: Diagnosis not present

## 2018-04-23 DIAGNOSIS — G510 Bell's palsy: Secondary | ICD-10-CM | POA: Diagnosis not present

## 2018-04-23 DIAGNOSIS — I110 Hypertensive heart disease with heart failure: Secondary | ICD-10-CM | POA: Diagnosis not present

## 2018-04-23 DIAGNOSIS — I2510 Atherosclerotic heart disease of native coronary artery without angina pectoris: Secondary | ICD-10-CM | POA: Diagnosis not present

## 2018-04-23 DIAGNOSIS — I509 Heart failure, unspecified: Secondary | ICD-10-CM | POA: Diagnosis not present

## 2018-05-01 DIAGNOSIS — E1165 Type 2 diabetes mellitus with hyperglycemia: Secondary | ICD-10-CM | POA: Diagnosis not present

## 2018-05-01 DIAGNOSIS — I252 Old myocardial infarction: Secondary | ICD-10-CM | POA: Diagnosis not present

## 2018-05-01 DIAGNOSIS — G510 Bell's palsy: Secondary | ICD-10-CM | POA: Diagnosis not present

## 2018-05-01 DIAGNOSIS — I2510 Atherosclerotic heart disease of native coronary artery without angina pectoris: Secondary | ICD-10-CM | POA: Diagnosis not present

## 2018-05-01 DIAGNOSIS — I110 Hypertensive heart disease with heart failure: Secondary | ICD-10-CM | POA: Diagnosis not present

## 2018-05-01 DIAGNOSIS — I509 Heart failure, unspecified: Secondary | ICD-10-CM | POA: Diagnosis not present

## 2018-05-15 DIAGNOSIS — I10 Essential (primary) hypertension: Secondary | ICD-10-CM | POA: Diagnosis not present

## 2018-05-15 DIAGNOSIS — E119 Type 2 diabetes mellitus without complications: Secondary | ICD-10-CM | POA: Diagnosis not present

## 2018-05-15 DIAGNOSIS — E875 Hyperkalemia: Secondary | ICD-10-CM | POA: Diagnosis not present

## 2018-05-20 DIAGNOSIS — I10 Essential (primary) hypertension: Secondary | ICD-10-CM | POA: Diagnosis not present

## 2018-05-20 DIAGNOSIS — E875 Hyperkalemia: Secondary | ICD-10-CM | POA: Diagnosis not present

## 2018-05-20 DIAGNOSIS — E0590 Thyrotoxicosis, unspecified without thyrotoxic crisis or storm: Secondary | ICD-10-CM | POA: Diagnosis not present

## 2018-05-20 DIAGNOSIS — E1165 Type 2 diabetes mellitus with hyperglycemia: Secondary | ICD-10-CM | POA: Diagnosis not present

## 2018-05-20 DIAGNOSIS — Z9119 Patient's noncompliance with other medical treatment and regimen: Secondary | ICD-10-CM | POA: Diagnosis not present

## 2018-06-25 DIAGNOSIS — E119 Type 2 diabetes mellitus without complications: Secondary | ICD-10-CM | POA: Diagnosis not present

## 2018-06-25 DIAGNOSIS — Z713 Dietary counseling and surveillance: Secondary | ICD-10-CM | POA: Diagnosis not present

## 2018-06-26 DIAGNOSIS — E1149 Type 2 diabetes mellitus with other diabetic neurological complication: Secondary | ICD-10-CM | POA: Diagnosis not present

## 2018-06-26 DIAGNOSIS — B351 Tinea unguium: Secondary | ICD-10-CM | POA: Diagnosis not present

## 2018-07-09 DIAGNOSIS — E1165 Type 2 diabetes mellitus with hyperglycemia: Secondary | ICD-10-CM | POA: Diagnosis not present

## 2018-07-09 DIAGNOSIS — Z713 Dietary counseling and surveillance: Secondary | ICD-10-CM | POA: Diagnosis not present

## 2018-07-22 DIAGNOSIS — E119 Type 2 diabetes mellitus without complications: Secondary | ICD-10-CM | POA: Diagnosis not present

## 2018-07-22 DIAGNOSIS — E1165 Type 2 diabetes mellitus with hyperglycemia: Secondary | ICD-10-CM | POA: Diagnosis not present

## 2018-09-01 DIAGNOSIS — M1711 Unilateral primary osteoarthritis, right knee: Secondary | ICD-10-CM | POA: Diagnosis not present

## 2018-09-01 DIAGNOSIS — M79604 Pain in right leg: Secondary | ICD-10-CM | POA: Diagnosis not present

## 2018-09-01 DIAGNOSIS — M79651 Pain in right thigh: Secondary | ICD-10-CM | POA: Diagnosis not present

## 2018-09-01 DIAGNOSIS — M1611 Unilateral primary osteoarthritis, right hip: Secondary | ICD-10-CM | POA: Diagnosis not present

## 2018-09-01 DIAGNOSIS — E119 Type 2 diabetes mellitus without complications: Secondary | ICD-10-CM | POA: Diagnosis not present

## 2018-09-10 DIAGNOSIS — R4189 Other symptoms and signs involving cognitive functions and awareness: Secondary | ICD-10-CM | POA: Diagnosis not present

## 2018-09-10 DIAGNOSIS — Z0000 Encounter for general adult medical examination without abnormal findings: Secondary | ICD-10-CM | POA: Diagnosis not present

## 2018-09-15 DIAGNOSIS — E039 Hypothyroidism, unspecified: Secondary | ICD-10-CM | POA: Diagnosis not present

## 2018-09-15 DIAGNOSIS — I509 Heart failure, unspecified: Secondary | ICD-10-CM | POA: Diagnosis not present

## 2018-09-15 DIAGNOSIS — E0590 Thyrotoxicosis, unspecified without thyrotoxic crisis or storm: Secondary | ICD-10-CM | POA: Diagnosis not present

## 2018-09-15 DIAGNOSIS — I10 Essential (primary) hypertension: Secondary | ICD-10-CM | POA: Diagnosis not present

## 2018-09-15 DIAGNOSIS — E1165 Type 2 diabetes mellitus with hyperglycemia: Secondary | ICD-10-CM | POA: Diagnosis not present

## 2018-09-15 DIAGNOSIS — M79604 Pain in right leg: Secondary | ICD-10-CM | POA: Diagnosis not present

## 2018-10-01 DIAGNOSIS — E119 Type 2 diabetes mellitus without complications: Secondary | ICD-10-CM | POA: Diagnosis not present
